# Patient Record
Sex: Male | Born: 1935 | Race: White | Hispanic: No | Marital: Married | Smoking: Never smoker
Health system: Southern US, Community
[De-identification: ages and names within clinical notes are randomized; demographics above are authoritative.]

## PROBLEM LIST (undated history)

## (undated) DIAGNOSIS — H3530 Unspecified macular degeneration: Secondary | ICD-10-CM

## (undated) DIAGNOSIS — E039 Hypothyroidism, unspecified: Secondary | ICD-10-CM

## (undated) DIAGNOSIS — I10 Essential (primary) hypertension: Secondary | ICD-10-CM

## (undated) HISTORY — PX: TONSILLECTOMY: SUR1361

## (undated) HISTORY — PX: NASAL POLYP EXCISION: SHX2068

---

## 1997-07-07 ENCOUNTER — Ambulatory Visit (HOSPITAL_COMMUNITY): Admission: RE | Admit: 1997-07-07 | Discharge: 1997-07-07 | Payer: Self-pay | Admitting: *Deleted

## 2001-11-07 ENCOUNTER — Encounter: Payer: Self-pay | Admitting: Internal Medicine

## 2001-11-07 ENCOUNTER — Encounter: Admission: RE | Admit: 2001-11-07 | Discharge: 2001-11-07 | Payer: Self-pay | Admitting: Internal Medicine

## 2002-10-08 ENCOUNTER — Ambulatory Visit (HOSPITAL_COMMUNITY): Admission: RE | Admit: 2002-10-08 | Discharge: 2002-10-08 | Payer: Self-pay | Admitting: *Deleted

## 2002-10-08 ENCOUNTER — Encounter: Payer: Self-pay | Admitting: *Deleted

## 2002-12-04 ENCOUNTER — Ambulatory Visit (HOSPITAL_COMMUNITY): Admission: RE | Admit: 2002-12-04 | Discharge: 2002-12-04 | Payer: Self-pay | Admitting: *Deleted

## 2003-12-30 ENCOUNTER — Encounter: Admission: RE | Admit: 2003-12-30 | Discharge: 2003-12-30 | Payer: Self-pay | Admitting: Internal Medicine

## 2005-06-17 ENCOUNTER — Encounter: Admission: RE | Admit: 2005-06-17 | Discharge: 2005-06-17 | Payer: Self-pay | Admitting: Internal Medicine

## 2007-12-11 ENCOUNTER — Encounter: Admission: RE | Admit: 2007-12-11 | Discharge: 2007-12-11 | Payer: Self-pay | Admitting: Internal Medicine

## 2008-05-29 ENCOUNTER — Emergency Department (HOSPITAL_BASED_OUTPATIENT_CLINIC_OR_DEPARTMENT_OTHER): Admission: EM | Admit: 2008-05-29 | Discharge: 2008-05-29 | Payer: Self-pay | Admitting: Emergency Medicine

## 2008-05-29 ENCOUNTER — Ambulatory Visit: Payer: Self-pay | Admitting: Interventional Radiology

## 2009-06-08 ENCOUNTER — Encounter: Admission: RE | Admit: 2009-06-08 | Discharge: 2009-06-08 | Payer: Self-pay | Admitting: Internal Medicine

## 2009-08-19 ENCOUNTER — Encounter: Admission: RE | Admit: 2009-08-19 | Discharge: 2009-08-19 | Payer: Self-pay | Admitting: Internal Medicine

## 2010-06-09 ENCOUNTER — Emergency Department (HOSPITAL_COMMUNITY)
Admission: EM | Admit: 2010-06-09 | Discharge: 2010-06-09 | Disposition: A | Discharge status: Home / Self Care | Payer: Medicare Other | Attending: Emergency Medicine | Admitting: Emergency Medicine

## 2010-06-09 ENCOUNTER — Emergency Department (HOSPITAL_COMMUNITY): Payer: Medicare Other

## 2010-06-09 DIAGNOSIS — Y929 Unspecified place or not applicable: Secondary | ICD-10-CM | POA: Insufficient documentation

## 2010-06-09 DIAGNOSIS — Z7982 Long term (current) use of aspirin: Secondary | ICD-10-CM | POA: Insufficient documentation

## 2010-06-09 DIAGNOSIS — S0003XA Contusion of scalp, initial encounter: Secondary | ICD-10-CM | POA: Insufficient documentation

## 2010-06-09 DIAGNOSIS — S61409A Unspecified open wound of unspecified hand, initial encounter: Secondary | ICD-10-CM | POA: Insufficient documentation

## 2010-06-09 DIAGNOSIS — IMO0002 Reserved for concepts with insufficient information to code with codable children: Secondary | ICD-10-CM | POA: Insufficient documentation

## 2010-06-09 DIAGNOSIS — Z79899 Other long term (current) drug therapy: Secondary | ICD-10-CM | POA: Insufficient documentation

## 2010-06-09 DIAGNOSIS — I10 Essential (primary) hypertension: Secondary | ICD-10-CM | POA: Insufficient documentation

## 2010-06-09 DIAGNOSIS — W010XXA Fall on same level from slipping, tripping and stumbling without subsequent striking against object, initial encounter: Secondary | ICD-10-CM | POA: Insufficient documentation

## 2010-06-09 DIAGNOSIS — K219 Gastro-esophageal reflux disease without esophagitis: Secondary | ICD-10-CM | POA: Insufficient documentation

## 2010-06-09 DIAGNOSIS — M79609 Pain in unspecified limb: Secondary | ICD-10-CM | POA: Insufficient documentation

## 2010-06-09 LAB — CBC
HCT: 35.5 % — ABNORMAL LOW (ref 39.0–52.0)
Hemoglobin: 12.5 g/dL — ABNORMAL LOW (ref 13.0–17.0)
MCH: 34.8 pg — ABNORMAL HIGH (ref 26.0–34.0)
MCHC: 35.2 g/dL (ref 30.0–36.0)
MCV: 98.9 fL (ref 78.0–100.0)
Platelets: 206 10*3/uL (ref 150–400)
RBC: 3.59 MIL/uL — ABNORMAL LOW (ref 4.22–5.81)
RDW: 12.9 % (ref 11.5–15.5)
WBC: 6.4 10*3/uL (ref 4.0–10.5)

## 2010-06-09 LAB — BASIC METABOLIC PANEL
BUN: 17 mg/dL (ref 6–23)
CO2: 30 mEq/L (ref 19–32)
Calcium: 9.4 mg/dL (ref 8.4–10.5)
Chloride: 101 mEq/L (ref 96–112)
Creatinine, Ser: 1.04 mg/dL (ref 0.4–1.5)
GFR calc Af Amer: >60 mL/min (ref >60)
GFR calc non Af Amer: >60 mL/min (ref >60)
Glucose, Bld: 91 mg/dL (ref 70–99)
Potassium: 3.3 mEq/L — ABNORMAL LOW (ref 3.5–5.1)
Sodium: 138 mEq/L (ref 135–145)

## 2010-06-09 LAB — URINALYSIS, ROUTINE W REFLEX MICROSCOPIC
Bilirubin Urine: NEGATIVE
Glucose, UA: NEGATIVE mg/dL
Hgb urine dipstick: NEGATIVE
Ketones, ur: NEGATIVE mg/dL
Nitrite: NEGATIVE
Protein, ur: NEGATIVE mg/dL
Specific Gravity, Urine: 1.015 (ref 1.005–1.030)
Urobilinogen, UA: 0.2 mg/dL (ref 0.0–1.0)
pH: 7 (ref 5.0–8.0)

## 2010-06-09 LAB — DIFFERENTIAL
Basophils Absolute: 0 10*3/uL (ref 0.0–0.1)
Basophils Relative: 0 % (ref 0–1)
Eosinophils Absolute: 0.1 10*3/uL (ref 0.0–0.7)
Eosinophils Relative: 1 % (ref 0–5)
Lymphocytes Relative: 17 % (ref 12–46)
Lymphs Abs: 1.1 10*3/uL (ref 0.7–4.0)
Monocytes Absolute: 0.4 10*3/uL (ref 0.1–1.0)
Monocytes Relative: 7 % (ref 3–12)
Neutro Abs: 4.8 10*3/uL (ref 1.7–7.7)
Neutrophils Relative %: 75 % (ref 43–77)

## 2010-07-30 NOTE — Op Note (Signed)
   NAME:  Dustin Roy, Dustin Roy                        ACCOUNT NO.:  [id number]   MEDICAL RECORD NO.:  [id number]                   PATIENT TYPE:  AMB   LOCATION:  ENDO                                 FACILITY:  [facility]   PHYSICIAN:  Georgiana Spinner, M.D.                 DATE OF BIRTH:  1921/02/17   DATE OF PROCEDURE:  10/08/2002  DATE OF DISCHARGE:                                 OPERATIVE REPORT   PROCEDURE:  Colonoscopy.   INDICATIONS:  Colon polyps.   ANESTHESIA:  Demerol 80 mg, Versed 7 mg.   DESCRIPTION OF PROCEDURE:  With the patient mildly sedated in the left  lateral decubitus position, the Olympus videoscopic variable-stiffness  colonoscope was inserted in the rectum and passed under direct vision  proximal to the splenic flexure, where we encountered tortuosity of the  colon that could not be reduced despite a change in position and abdominal  pressure.  Therefore, it was elected to terminate and withdraw, taking  circumferential views of the colonic mucosa visualized until we reached the  rectum, which appeared normal on direct and retroflexed view.  The endoscope  was straightened and withdrawn.  The patient's vital signs and pulse  oximetry remained stable.  The patient tolerated the procedure well without  apparent complications.   FINDINGS:  Negative examination.   PLAN:  Barium enema to view the proximal colon.                                               Georgiana Spinner, M.D.    GMO/MEDQ  D:  10/08/2002  T:  10/08/2002  Job:  [id number]

## 2010-07-30 NOTE — Op Note (Signed)
   NAME:  JOHANN, SANTONE                        ACCOUNT NO.:  [id number]   MEDICAL RECORD NO.:  [id number]                   PATIENT TYPE:  AMB   LOCATION:  ENDO                                 FACILITY:  [facility]   PHYSICIAN:  Georgiana Spinner, M.D.                 DATE OF BIRTH:  1920-12-18   DATE OF PROCEDURE:  DATE OF DISCHARGE:                                 OPERATIVE REPORT   PROCEDURE:  Upper endoscopy.   INDICATIONS:  GERD.   ANESTHESIA:  Demerol 50 mg, Versed 5 mg.   DESCRIPTION OF PROCEDURE:  With the patient mildly sedated in the left  lateral decubitus position, the Olympus videoscopic endoscope was inserted  in the mouth and passed under direct vision through the esophagus, which  appeared normal.  There was no evidence of Barrett's esophagus seen.  We  entered into the stomach.  The  fundus, body, antrum, duodenal bulb and  second portion of the duodenum  were visualized. From this point, the  endoscope was slowly withdrawn taking circumferential views of the duodenal  mucosa until the endoscope then pulled back into the stomach, placed in  retroflexion and viewed the stomach from below.  The endoscope was then  straightened and withdrawn, taking circumferential views of the remaining  gastric and esophageal mucosa, stopping in the antrum of the stomach where  erythematous changes were seen, photographed and biopsied.  The patient's  vital signs and pulse oximetry remained stable.  The patient tolerated the  procedure well without apparent complication.   FINDINGS:  Erythematous changes of antrum.  Await biopsy report. The patient  will call me for results and followup with me as an outpatient.                                                Georgiana Spinner, M.D.    GMO/MEDQ  D:  12/04/2002  T:  12/04/2002  Job:  [id number]   cc:   Antony Madura, M.D.  [address]  [place]  [place] [address]  Fax: [phone]

## 2010-08-16 ENCOUNTER — Ambulatory Visit
Admission: RE | Admit: 2010-08-16 | Discharge: 2010-08-16 | Disposition: A | Discharge status: Home / Self Care | Payer: Medicare Other | Source: Ambulatory Visit | Attending: Internal Medicine | Admitting: Internal Medicine

## 2010-08-16 ENCOUNTER — Other Ambulatory Visit: Payer: Self-pay | Admitting: Internal Medicine

## 2010-08-16 DIAGNOSIS — R413 Other amnesia: Secondary | ICD-10-CM

## 2011-02-28 ENCOUNTER — Ambulatory Visit
Admission: RE | Admit: 2011-02-28 | Discharge: 2011-02-28 | Disposition: A | Discharge status: Home / Self Care | Payer: Medicare Other | Source: Ambulatory Visit | Attending: Internal Medicine | Admitting: Internal Medicine

## 2011-02-28 ENCOUNTER — Other Ambulatory Visit: Payer: Self-pay | Admitting: Internal Medicine

## 2011-02-28 DIAGNOSIS — M25519 Pain in unspecified shoulder: Secondary | ICD-10-CM

## 2011-06-13 IMAGING — CT CT HEAD W/O CM
2 series · 16 of 30 positions shown, 18 images · non-contrast
Comparison: 06/17/2005

CLINICAL DATA: Memory loss.  Recent falls.

CT HEAD WITHOUT CONTRAST
TECHNIQUE: Contiguous axial images were obtained from the base of
the skull through the vertex without contrast.

[Series 3: head bone · axial · 0.49mm/px · z∈[+41,+176]mm · 8 of 64 slices shown]
[im 7/64  bone]
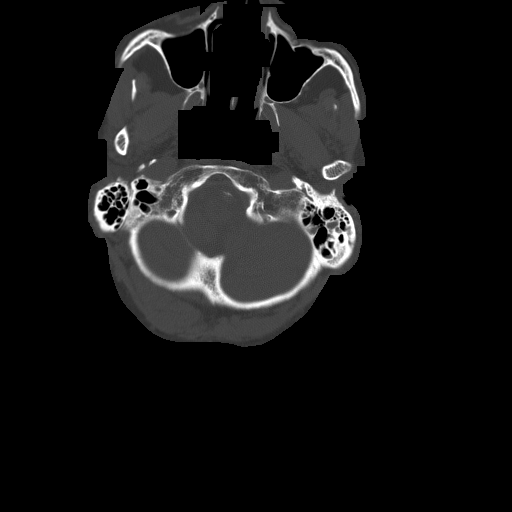
[im 14/64  bone]
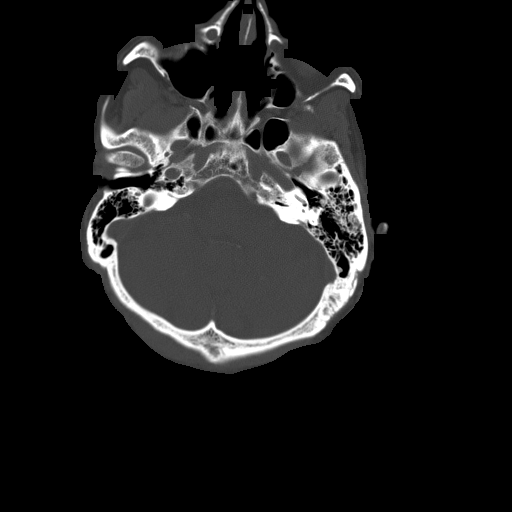
[im 20/64  bone]
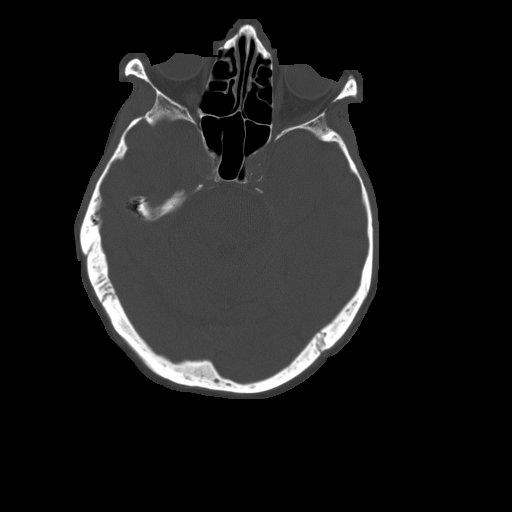
[im 27/64  bone]
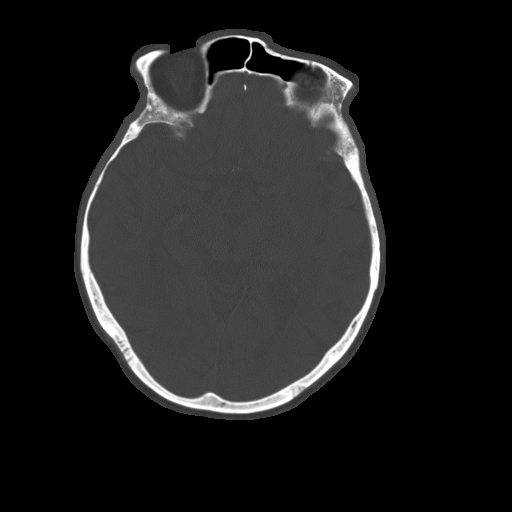
[im 37/64  bone]
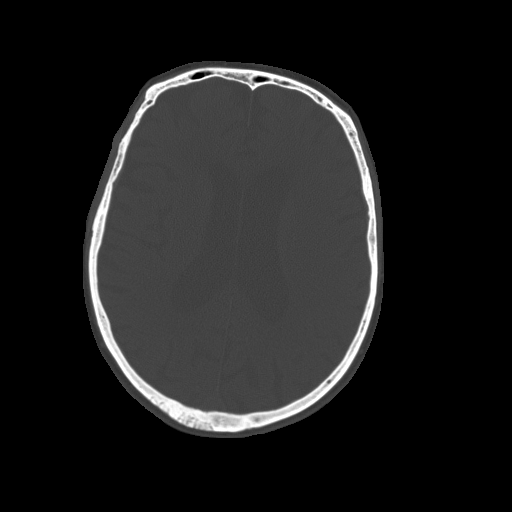
[im 44/64  bone]
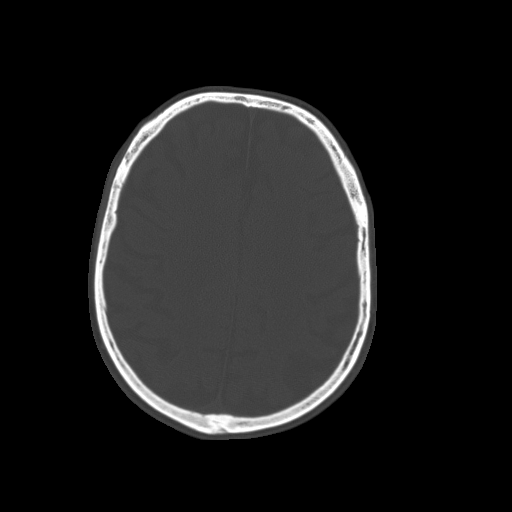
[im 50/64  bone]
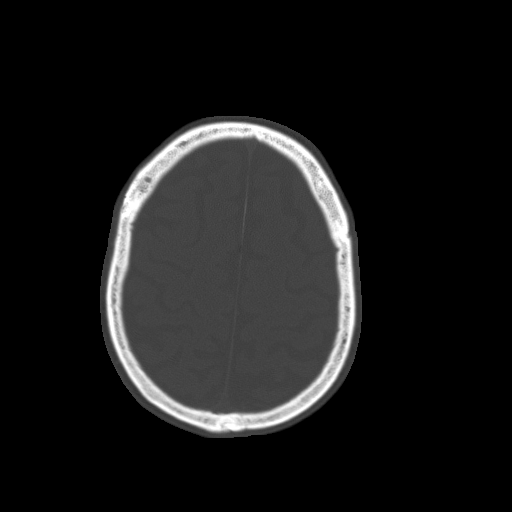
[im 57/64  bone]
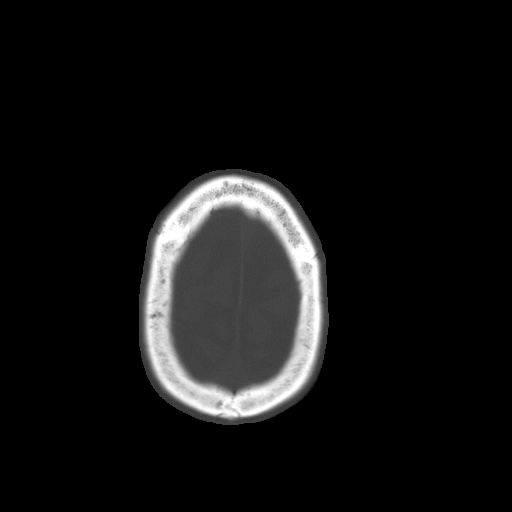

[Series 32: 3d filtered head w/o · axial · non-contrast · 0.49mm/px · z∈[+42,+172]mm · 8 of 32 slices shown, 10 images]
[im 4/32  brain]
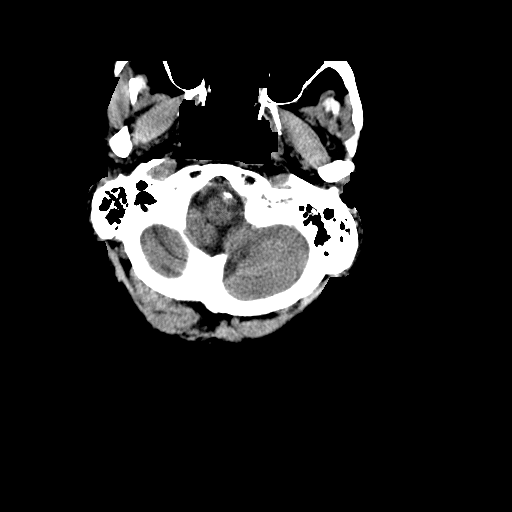
[im 4/32  bone]
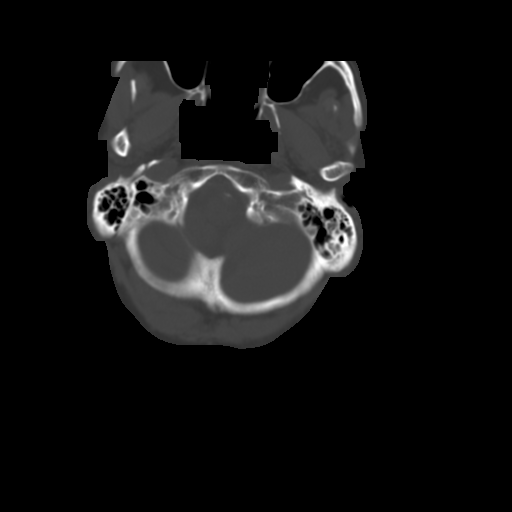
[im 7/32  brain]
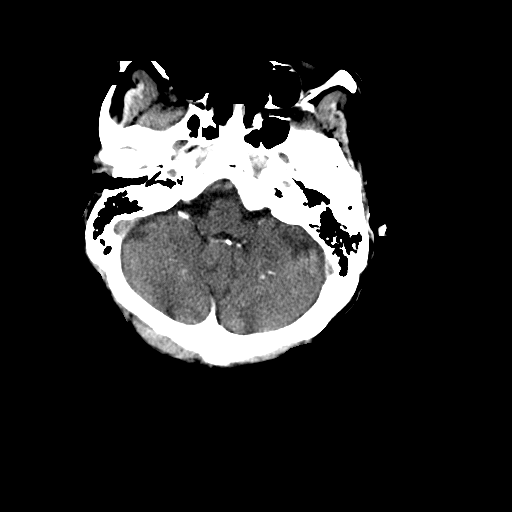
[im 11/32  brain]
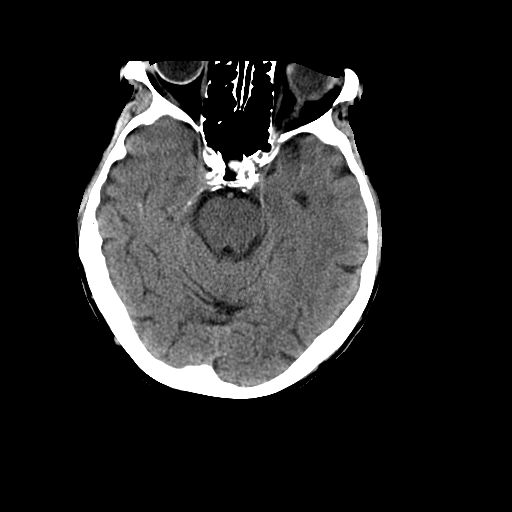
[im 14/32  brain]
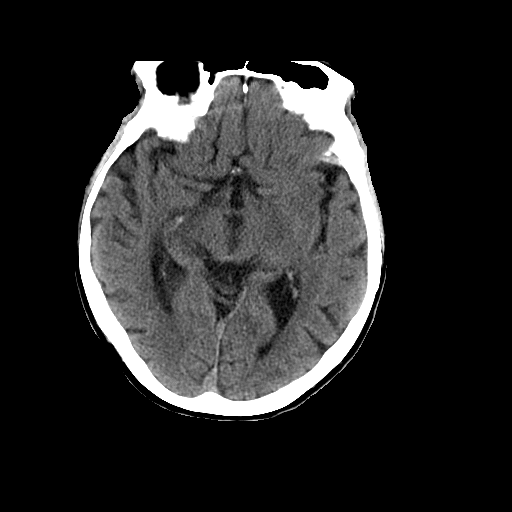
[im 18/32  brain]
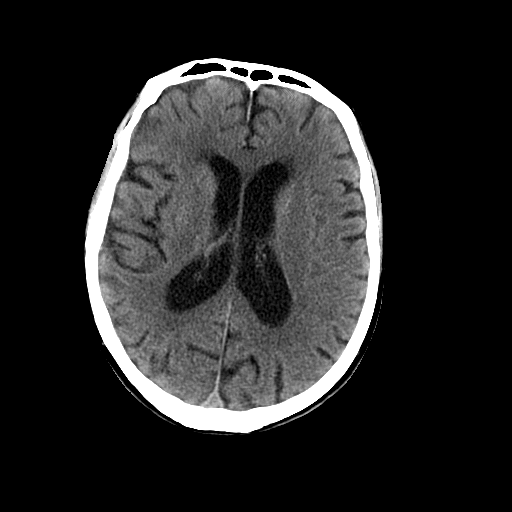
[im 18/32  bone]
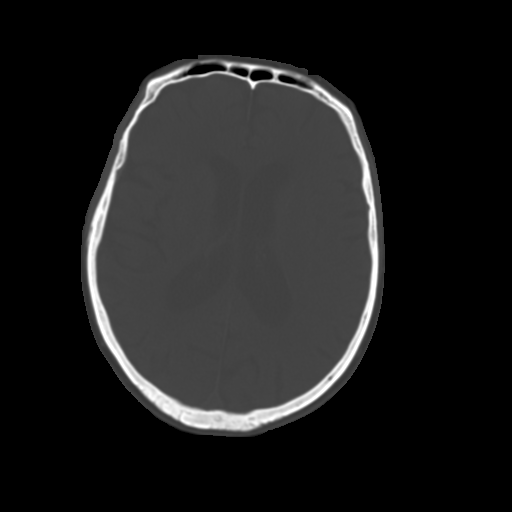
[im 21/32  brain]
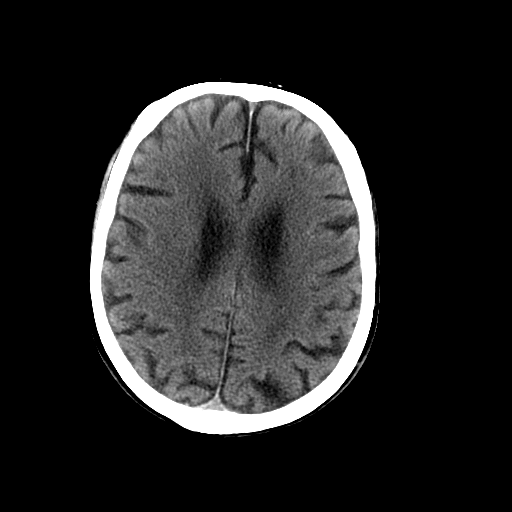
[im 25/32  brain]
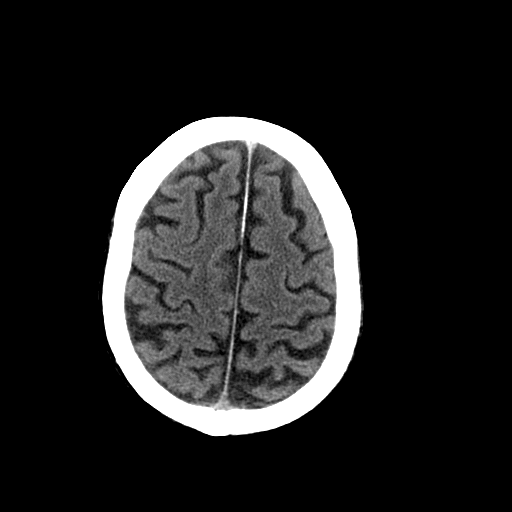
[im 28/32  brain]
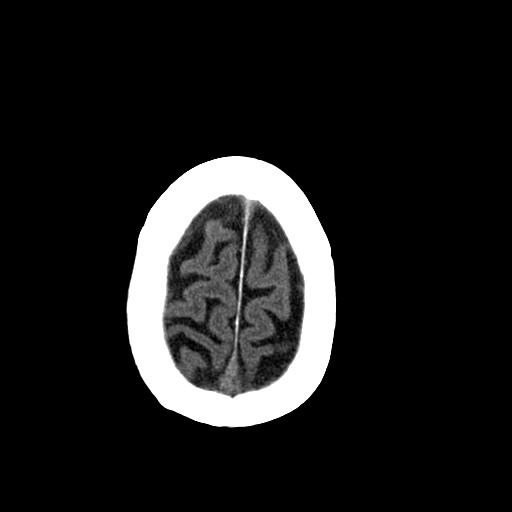

[16 of 30 positions shown; findings below may reference images not displayed]

FINDINGS: The brain shows a typical degree of age related atrophy.
There are mild to moderate chronic small vessel changes affecting
the hemispheric white matter, also fairly typical of age.  There is
no evidence of acute infarction, mass lesion, hemorrhage,
hydrocephalus or extra-axial collection.  There is atherosclerotic
calcification of the major vessels at the base the brain.  There is
some calcification within the cerebellar nuclei and the basal
ganglia.  The calvarium is unremarkable.  Sinuses, middle ears and
mastoids are clear.
IMPRESSION: No acute or reversible process.  No specific cause of the patient's
described symptoms is identified.  Atrophy and chronic small vessel
disease, fairly typical of age.

## 2012-01-25 ENCOUNTER — Other Ambulatory Visit: Payer: Self-pay | Admitting: Internal Medicine

## 2012-01-25 DIAGNOSIS — R42 Dizziness and giddiness: Secondary | ICD-10-CM

## 2012-01-27 ENCOUNTER — Ambulatory Visit
Admission: RE | Admit: 2012-01-27 | Discharge: 2012-01-27 | Disposition: A | Discharge status: Home / Self Care | Payer: Medicare Other | Source: Ambulatory Visit | Attending: Internal Medicine | Admitting: Internal Medicine

## 2012-01-27 DIAGNOSIS — R42 Dizziness and giddiness: Secondary | ICD-10-CM

## 2012-09-02 ENCOUNTER — Emergency Department (HOSPITAL_COMMUNITY): Payer: Medicare Other

## 2012-09-02 ENCOUNTER — Encounter (HOSPITAL_COMMUNITY): Payer: Self-pay | Admitting: Emergency Medicine

## 2012-09-02 ENCOUNTER — Inpatient Hospital Stay (HOSPITAL_COMMUNITY)
Admission: EM | Admit: 2012-09-02 | Discharge: 2012-09-13 | DRG: 066 | Disposition: A | Discharge status: Skilled Nursing Facility | Payer: Medicare Other | Attending: Internal Medicine | Admitting: Internal Medicine

## 2012-09-02 DIAGNOSIS — I639 Cerebral infarction, unspecified: Secondary | ICD-10-CM

## 2012-09-02 DIAGNOSIS — E039 Hypothyroidism, unspecified: Secondary | ICD-10-CM

## 2012-09-02 DIAGNOSIS — S41111A Laceration without foreign body of right upper arm, initial encounter: Secondary | ICD-10-CM

## 2012-09-02 DIAGNOSIS — R5381 Other malaise: Secondary | ICD-10-CM | POA: Diagnosis not present

## 2012-09-02 DIAGNOSIS — R5383 Other fatigue: Secondary | ICD-10-CM | POA: Diagnosis not present

## 2012-09-02 DIAGNOSIS — I10 Essential (primary) hypertension: Secondary | ICD-10-CM

## 2012-09-02 DIAGNOSIS — R4182 Altered mental status, unspecified: Secondary | ICD-10-CM

## 2012-09-02 DIAGNOSIS — R569 Unspecified convulsions: Secondary | ICD-10-CM

## 2012-09-02 DIAGNOSIS — E538 Deficiency of other specified B group vitamins: Secondary | ICD-10-CM | POA: Diagnosis present

## 2012-09-02 DIAGNOSIS — T4275XA Adverse effect of unspecified antiepileptic and sedative-hypnotic drugs, initial encounter: Secondary | ICD-10-CM | POA: Diagnosis not present

## 2012-09-02 DIAGNOSIS — Z66 Do not resuscitate: Secondary | ICD-10-CM | POA: Diagnosis present

## 2012-09-02 DIAGNOSIS — Y92009 Unspecified place in unspecified non-institutional (private) residence as the place of occurrence of the external cause: Secondary | ICD-10-CM

## 2012-09-02 DIAGNOSIS — IMO0002 Reserved for concepts with insufficient information to code with codable children: Secondary | ICD-10-CM

## 2012-09-02 DIAGNOSIS — F0391 Unspecified dementia with behavioral disturbance: Secondary | ICD-10-CM

## 2012-09-02 DIAGNOSIS — R1310 Dysphagia, unspecified: Secondary | ICD-10-CM | POA: Diagnosis not present

## 2012-09-02 DIAGNOSIS — Z23 Encounter for immunization: Secondary | ICD-10-CM

## 2012-09-02 DIAGNOSIS — S0003XA Contusion of scalp, initial encounter: Secondary | ICD-10-CM | POA: Diagnosis present

## 2012-09-02 DIAGNOSIS — S41119A Laceration without foreign body of unspecified upper arm, initial encounter: Secondary | ICD-10-CM

## 2012-09-02 DIAGNOSIS — F0390 Unspecified dementia without behavioral disturbance: Secondary | ICD-10-CM

## 2012-09-02 DIAGNOSIS — R509 Fever, unspecified: Secondary | ICD-10-CM

## 2012-09-02 DIAGNOSIS — E785 Hyperlipidemia, unspecified: Secondary | ICD-10-CM | POA: Diagnosis present

## 2012-09-02 DIAGNOSIS — S1093XA Contusion of unspecified part of neck, initial encounter: Secondary | ICD-10-CM | POA: Diagnosis present

## 2012-09-02 DIAGNOSIS — R4701 Aphasia: Secondary | ICD-10-CM

## 2012-09-02 DIAGNOSIS — W19XXXA Unspecified fall, initial encounter: Secondary | ICD-10-CM

## 2012-09-02 DIAGNOSIS — R55 Syncope and collapse: Secondary | ICD-10-CM | POA: Diagnosis present

## 2012-09-02 DIAGNOSIS — F0781 Postconcussional syndrome: Secondary | ICD-10-CM

## 2012-09-02 DIAGNOSIS — I6350 Cerebral infarction due to unspecified occlusion or stenosis of unspecified cerebral artery: Principal | ICD-10-CM | POA: Diagnosis present

## 2012-09-02 HISTORY — DX: Hypothyroidism, unspecified: E03.9

## 2012-09-02 HISTORY — DX: Essential (primary) hypertension: I10

## 2012-09-02 HISTORY — DX: Unspecified macular degeneration: H35.30

## 2012-09-02 LAB — COMPREHENSIVE METABOLIC PANEL
ALT: 16 U/L (ref 0–53)
AST: 25 U/L (ref 0–37)
Albumin: 3.5 g/dL (ref 3.5–5.2)
Alkaline Phosphatase: 120 U/L — ABNORMAL HIGH (ref 39–117)
BUN: 24 mg/dL — ABNORMAL HIGH (ref 6–23)
CO2: 28 mEq/L (ref 19–32)
Calcium: 9.4 mg/dL (ref 8.4–10.5)
Chloride: 100 mEq/L (ref 96–112)
Creatinine, Ser: 1.16 mg/dL (ref 0.50–1.35)
GFR calc Af Amer: 62 mL/min — ABNORMAL LOW (ref >90)
GFR calc non Af Amer: 54 mL/min — ABNORMAL LOW (ref >90)
Glucose, Bld: 115 mg/dL — ABNORMAL HIGH (ref 70–99)
Potassium: 3.4 mEq/L — ABNORMAL LOW (ref 3.5–5.1)
Sodium: 138 mEq/L (ref 135–145)
Total Bilirubin: 0.4 mg/dL (ref 0.3–1.2)
Total Protein: 6.5 g/dL (ref 6.0–8.3)

## 2012-09-02 LAB — URINALYSIS, ROUTINE W REFLEX MICROSCOPIC
Leukocytes, UA: NEGATIVE
Nitrite: NEGATIVE
Specific Gravity, Urine: 1.022 (ref 1.005–1.030)
Urobilinogen, UA: 0.2 mg/dL (ref 0.0–1.0)

## 2012-09-02 LAB — CBC WITH DIFFERENTIAL/PLATELET
Basophils Absolute: 0 10*3/uL (ref 0.0–0.1)
Basophils Relative: 0 % (ref 0–1)
Eosinophils Absolute: 0.1 10*3/uL (ref 0.0–0.7)
Eosinophils Relative: 1 % (ref 0–5)
HCT: 36.8 % — ABNORMAL LOW (ref 39.0–52.0)
Hemoglobin: 12.9 g/dL — ABNORMAL LOW (ref 13.0–17.0)
Lymphocytes Relative: 9 % — ABNORMAL LOW (ref 12–46)
Lymphs Abs: 0.9 10*3/uL (ref 0.7–4.0)
MCH: 34.7 pg — ABNORMAL HIGH (ref 26.0–34.0)
MCHC: 35.1 g/dL (ref 30.0–36.0)
MCV: 98.9 fL (ref 78.0–100.0)
Monocytes Absolute: 0.5 10*3/uL (ref 0.1–1.0)
Monocytes Relative: 5 % (ref 3–12)
Neutro Abs: 9.4 10*3/uL — ABNORMAL HIGH (ref 1.7–7.7)
Neutrophils Relative %: 86 % — ABNORMAL HIGH (ref 43–77)
Platelets: 181 10*3/uL (ref 150–400)
RBC: 3.72 MIL/uL — ABNORMAL LOW (ref 4.22–5.81)
RDW: 13.3 % (ref 11.5–15.5)
WBC: 10.9 10*3/uL — ABNORMAL HIGH (ref 4.0–10.5)

## 2012-09-02 LAB — URINE MICROSCOPIC-ADD ON

## 2012-09-02 LAB — PROTIME-INR: Prothrombin Time: 13.2 seconds (ref 11.6–15.2)

## 2012-09-02 LAB — TROPONIN I: Troponin I: <0.3 ng/mL (ref <0.30)

## 2012-09-02 LAB — APTT: aPTT: 26 seconds (ref 24–37)

## 2012-09-02 MED ORDER — LORAZEPAM 2 MG/ML IJ SOLN
0.5000 mg | Freq: Once | INTRAMUSCULAR | Status: AC
  Administered 2012-09-02: 0.5 mg via INTRAVENOUS
  Filled 2012-09-02: qty 1

## 2012-09-02 MED ORDER — TETANUS-DIPHTH-ACELL PERTUSSIS 5-2.5-18.5 LF-MCG/0.5 IM SUSP
0.5000 mL | Freq: Once | INTRAMUSCULAR | Status: AC
  Administered 2012-09-03: 0.5 mL via INTRAMUSCULAR
  Filled 2012-09-02 (×2): qty 0.5

## 2012-09-02 NOTE — ED Notes (Signed)
Pt medicated with  Ativan iv for the c-t

## 2012-09-02 NOTE — ED Notes (Signed)
Patient transported to CT 

## 2012-09-02 NOTE — ED Notes (Signed)
The pt was taken to c-t one hour ago but they were unable to scan him

## 2012-09-02 NOTE — ED Provider Notes (Signed)
History     CSN: [id number]  Arrival date & time 09/02/12  4098   First MD Initiated Contact with Patient 09/02/12 1926      Chief Complaint  Patient presents with  . Altered Mental Status    (Consider location/radiation/quality/duration/timing/severity/associated sxs/prior treatment) HPI Pt found down in bathroom for unknown period of time. Pt has baseline dementia. Level 5 caveat. Pt seen 20-30 min prior to being found down. Pt with RUE skin tear. Unknown LOC.  Past Medical History  Diagnosis Date  . Hypertension   . Hypothyroid     History reviewed. No pertinent past surgical history.  History reviewed. No pertinent family history.  History  Substance Use Topics  . Smoking status: Never Smoker   . Smokeless tobacco: Not on file  . Alcohol Use: Not on file      Review of Systems  Unable to perform ROS   Allergies  Amoxicillin  Home Medications   No current outpatient prescriptions on file.  BP 163/70  Pulse 84  Temp(Src) 97.3 F (36.3 C) (Axillary)  Resp 20  Ht 5\' 9"  (1.753 m)  Wt 136 lb 3.9 oz (61.8 kg)  BMI 20.11 kg/m2  SpO2 100%  Physical Exam  Nursing note and vitals reviewed. Constitutional: He appears well-developed and well-nourished. No distress.  HENT:  Head: Normocephalic and atraumatic.  Mouth/Throat: Oropharynx is clear and moist.  Eyes: EOM are normal. Pupils are equal, round, and reactive to light.  Neck: Normal range of motion. Neck supple.  No posterior cervical tenderness  Cardiovascular: Regular rhythm.   tachycardia  Pulmonary/Chest: Effort normal and breath sounds normal. No respiratory distress. He has no wheezes. He has no rales.  Abdominal: Soft. Bowel sounds are normal. He exhibits no distension and no mass. There is no tenderness. There is no rebound and no guarding.  Musculoskeletal: Normal range of motion. He exhibits no edema and no tenderness.  Neurological: He is alert.  Moving all ext, confused, aggitated.  Following simple commands.   Skin: Skin is warm and dry. No rash noted. No erythema.    ED Course  Procedures (including critical care time)  Labs Reviewed  CBC WITH DIFFERENTIAL - Abnormal; Notable for the following:    WBC 10.9 (*)    RBC 3.72 (*)    Hemoglobin 12.9 (*)    HCT 36.8 (*)    MCH 34.7 (*)    Neutrophils Relative % 86 (*)    Neutro Abs 9.4 (*)    Lymphocytes Relative 9 (*)    All other components within normal limits  COMPREHENSIVE METABOLIC PANEL - Abnormal; Notable for the following:    Potassium 3.4 (*)    Glucose, Bld 115 (*)    BUN 24 (*)    Alkaline Phosphatase 120 (*)    GFR calc non Af Amer 54 (*)    GFR calc Af Amer 62 (*)    All other components within normal limits  URINALYSIS, ROUTINE W REFLEX MICROSCOPIC - Abnormal; Notable for the following:    APPearance CLOUDY (*)    Hgb urine dipstick TRACE (*)    All other components within normal limits  URINE MICROSCOPIC-ADD ON - Abnormal; Notable for the following:    Squamous Epithelial / LPF FEW (*)    Bacteria, UA FEW (*)    Casts HYALINE CASTS (*)    All other components within normal limits  CBC - Abnormal; Notable for the following:    RBC 3.83 (*)    HCT  37.7 (*)    MCH 35.0 (*)    All other components within normal limits  COMPREHENSIVE METABOLIC PANEL - Abnormal; Notable for the following:    Glucose, Bld 110 (*)    Albumin 3.0 (*)    Alkaline Phosphatase 120 (*)    GFR calc non Af Amer 72 (*)    GFR calc Af Amer 84 (*)    All other components within normal limits  URINE CULTURE  TROPONIN I  PROTIME-INR  APTT  TROPONIN I  TROPONIN I  TROPONIN I  TSH  MAGNESIUM  TSH  T4, FREE  RPR  FOLATE  VITAMIN B12  SEDIMENTATION RATE   Ct Head Wo Contrast  09/02/2012   *RADIOLOGY REPORT*  Clinical Data:  Altered mental status  CT HEAD WITHOUT CONTRAST CT CERVICAL SPINE WITHOUT CONTRAST  Technique:  Multidetector CT imaging of the head and cervical spine was performed following the standard  protocol without intravenous contrast.  Multiplanar CT image reconstructions of the cervical spine were also generated.  Comparison:  CT 01/27/2012  CT HEAD  Findings: Moderate atrophy.  Chronic microvascular ischemic change in the white matter.  No acute infarct.  Negative for hemorrhage or mass.  No subdural fluid collection.  Negative for skull fracture.  IMPRESSION: No acute intracranial abnormality.  CT CERVICAL SPINE  Findings: Negative for fracture  Normal alignment.  Mild disc degeneration and spondylosis C4-C7. Mild facet degeneration.  IMPRESSION: Negative for fracture.   Original Report Authenticated By: Janeece Riggers, M.D.   Ct Cervical Spine Wo Contrast  09/02/2012   *RADIOLOGY REPORT*  Clinical Data:  Altered mental status  CT HEAD WITHOUT CONTRAST CT CERVICAL SPINE WITHOUT CONTRAST  Technique:  Multidetector CT imaging of the head and cervical spine was performed following the standard protocol without intravenous contrast.  Multiplanar CT image reconstructions of the cervical spine were also generated.  Comparison:  CT 01/27/2012  CT HEAD  Findings: Moderate atrophy.  Chronic microvascular ischemic change in the white matter.  No acute infarct.  Negative for hemorrhage or mass.  No subdural fluid collection.  Negative for skull fracture.  IMPRESSION: No acute intracranial abnormality.  CT CERVICAL SPINE  Findings: Negative for fracture  Normal alignment.  Mild disc degeneration and spondylosis C4-C7. Mild facet degeneration.  IMPRESSION: Negative for fracture.   Original Report Authenticated By: Janeece Riggers, M.D.   Dg Chest Port 1 View  09/02/2012   *RADIOLOGY REPORT*  Clinical Data: Altered mental status  PORTABLE CHEST - 1 VIEW  Comparison: None  Findings: Mild left lower lobe atelectasis.  Negative for heart failure or pneumonia.  No mass or effusion.  Severe degenerative change in the shoulder joints bilaterally.  IMPRESSION: Mild left lower lobe atelectasis   Original Report Authenticated  By: Janeece Riggers, M.D.     1. Fall, initial encounter   2. Altered mental status   3. Skin tear   4. Concussion syndrome   5. HTN (hypertension)   6. Hypothyroidism       MDM    Pt remains confused and mildy agitated. Wife states not at baseline. Discussed with [facility] who will admit       Loren Racer, MD 09/04/12 [id number]

## 2012-09-02 NOTE — ED Notes (Signed)
Per EMS- Pt found by wife on floor between bathtub and shower. Skin tear to R foor arm 3-4'' in length. Bleeding controlled with compression bandage. Pt has Hx of alzheimer's. Pt not at baseline per wife. HR initially 120's O2 mid 90's. CBG 140. Unknown time in bathroom on floor. Stroke screen negative with EMS. History of HTN and hypothyroidism.

## 2012-09-03 ENCOUNTER — Encounter (HOSPITAL_COMMUNITY): Payer: Self-pay | Admitting: General Practice

## 2012-09-03 DIAGNOSIS — Y92009 Unspecified place in unspecified non-institutional (private) residence as the place of occurrence of the external cause: Secondary | ICD-10-CM

## 2012-09-03 DIAGNOSIS — I10 Essential (primary) hypertension: Secondary | ICD-10-CM | POA: Diagnosis present

## 2012-09-03 DIAGNOSIS — W19XXXA Unspecified fall, initial encounter: Secondary | ICD-10-CM

## 2012-09-03 DIAGNOSIS — E039 Hypothyroidism, unspecified: Secondary | ICD-10-CM | POA: Diagnosis present

## 2012-09-03 DIAGNOSIS — F0781 Postconcussional syndrome: Secondary | ICD-10-CM | POA: Insufficient documentation

## 2012-09-03 LAB — TSH: TSH: 1.297 u[IU]/mL (ref 0.350–4.500)

## 2012-09-03 LAB — TROPONIN I: Troponin I: <0.3 ng/mL (ref <0.30)

## 2012-09-03 MED ORDER — TRIAMTERENE-HCTZ 37.5-25 MG PO TABS
1.0000 | ORAL_TABLET | Freq: Every day | ORAL | Status: DC
  Administered 2012-09-03 – 2012-09-04 (×2): 1 via ORAL
  Filled 2012-09-03 (×4): qty 1

## 2012-09-03 MED ORDER — LORAZEPAM 2 MG/ML IJ SOLN: 0.5000 mg | Freq: Once | INTRAMUSCULAR | Status: DC

## 2012-09-03 MED ORDER — LORAZEPAM 0.5 MG PO TABS
0.5000 mg | ORAL_TABLET | Freq: Once | ORAL | Status: AC
  Administered 2012-09-03: 0.5 mg via ORAL
  Filled 2012-09-03: qty 1

## 2012-09-03 MED ORDER — ASPIRIN EC 81 MG PO TBEC
81.0000 mg | DELAYED_RELEASE_TABLET | Freq: Every day | ORAL | Status: DC
  Administered 2012-09-03 – 2012-09-04 (×2): 81 mg via ORAL
  Filled 2012-09-03 (×6): qty 1

## 2012-09-03 MED ORDER — LEVOTHYROXINE SODIUM 50 MCG PO TABS
50.0000 ug | ORAL_TABLET | Freq: Every day | ORAL | Status: DC
  Administered 2012-09-03 – 2012-09-05 (×3): 50 ug via ORAL
  Filled 2012-09-03 (×5): qty 1

## 2012-09-03 NOTE — ED Notes (Signed)
Water and graham crackers given to patient to eat per Dr.Le

## 2012-09-03 NOTE — ED Notes (Signed)
Admitting doctor at the bedside 

## 2012-09-03 NOTE — ED Notes (Signed)
Report called to 4n 

## 2012-09-03 NOTE — Progress Notes (Signed)
[facility] PROGRESS NOTE  BIRDIE BEVERIDGE ZOX:[id number] DOB: Jul 26, 1920 DOA: 09/02/2012 PCP: No primary provider on file.  Assessment/Plan: 1. Postconcussive syndrome; per wife patient at baseline except for increased weakness in lower extremities. Obtain PT/OT for evaluation 2. dementia; obtain neuro consult for dementia workup to discuss care options with wife  HPI/Subjective: Dustin Roy is an 79 y.o. WM PMHx of noticeable dementia which started 4 months ago her wife (was off her Aricept by PCP never made decision on taking, never evaluated by neuro) , HTN, hypothyroidism, lives at home with his wife, found down in his bathroom. He reportedly hit his head. No seizure activity reported. There has been no complaint of HA, chest pain, nausea, vomiting, palpitation or lightheadedness. Evaluation in the ER included negative head CT and unremarkable serology. No EKG done. Wife stated that he was a little more confused than usual, so hospitalist was asked to admit him for OBS, likely due to post concussion syndrome. TODAY alert and oriented x1 (to person), able to follow commands   Objective: Filed Vitals:   09/03/12 0030 09/03/12 0206 09/03/12 0542 09/03/12 1548  BP: 131/64 161/67 145/68 134/85  Pulse: 79 79 74 93  Temp: 97.9 F (36.6 C) 97.4 F (36.3 C) 97.7 F (36.5 C) 97.2 F (36.2 C)  TempSrc: Axillary Oral Oral Oral  Resp: 16 17 17 20   Height:  5\' 9"  (1.753 m)    Weight:  61.8 kg (136 lb 3.9 oz)    SpO2: 100% 97% 96% 100%    Intake/Output Summary (Last 24 hours) at 09/03/12 2005 Last data filed at 09/03/12 1330  Gross per 24 hour  Intake    240 ml  Output      3 ml  Net    237 ml   Filed Weights   09/03/12 0206  Weight: 61.8 kg (136 lb 3.9 oz)    Exam:   General:  Sitting comfortably in bed confused  Cardiovascular: Regular rhythm and rate, negative murmurs rubs gallops  Respiratory: Clear to auscultation bilaterally  Abdomen: Soft, nontender,  nondistended plus bowel sounds  Neurologic alert and oriented x1 (2 self), unable to answer simple questions (confabulates), INR is 2 through 12 intact, upper extremity/lower extremity strength 5/5, sensation intact. Remainder of exam deferred   Data Reviewed: Basic Metabolic Panel:  Recent Labs Lab 09/02/12 2004  NA 138  K 3.4*  CL 100  CO2 28  GLUCOSE 115*  BUN 24*  CREATININE 1.16  CALCIUM 9.4   Liver Function Tests:  Recent Labs Lab 09/02/12 2004  AST 25  ALT 16  ALKPHOS 120*  BILITOT 0.4  PROT 6.5  ALBUMIN 3.5   No results found for this basename: LIPASE, AMYLASE,  in the last 168 hours No results found for this basename: AMMONIA,  in the last 168 hours CBC:  Recent Labs Lab 09/02/12 2004  WBC 10.9*  NEUTROABS 9.4*  HGB 12.9*  HCT 36.8*  MCV 98.9  PLT 181   Cardiac Enzymes:  Recent Labs Lab 09/02/12 2004 09/03/12 0052 09/03/12 0650 09/03/12 1228  TROPONINI <0.30 <0.30 <0.30 <0.30   BNP (last 3 results) No results found for this basename: PROBNP,  in the last 8760 hours CBG: No results found for this basename: GLUCAP,  in the last 168 hours  No results found for this or any previous visit (from the past 240 hour(s)).   Studies: Ct Head Wo Contrast  09/02/2012   *RADIOLOGY REPORT*  Clinical Data:  Altered mental  status  CT HEAD WITHOUT CONTRAST CT CERVICAL SPINE WITHOUT CONTRAST  Technique:  Multidetector CT imaging of the head and cervical spine was performed following the standard protocol without intravenous contrast.  Multiplanar CT image reconstructions of the cervical spine were also generated.  Comparison:  CT 01/27/2012  CT HEAD  Findings: Moderate atrophy.  Chronic microvascular ischemic change in the white matter.  No acute infarct.  Negative for hemorrhage or mass.  No subdural fluid collection.  Negative for skull fracture.  IMPRESSION: No acute intracranial abnormality.  CT CERVICAL SPINE  Findings: Negative for fracture  Normal  alignment.  Mild disc degeneration and spondylosis C4-C7. Mild facet degeneration.  IMPRESSION: Negative for fracture.   Original Report Authenticated By: Janeece Riggers, M.D.   Ct Cervical Spine Wo Contrast  09/02/2012   *RADIOLOGY REPORT*  Clinical Data:  Altered mental status  CT HEAD WITHOUT CONTRAST CT CERVICAL SPINE WITHOUT CONTRAST  Technique:  Multidetector CT imaging of the head and cervical spine was performed following the standard protocol without intravenous contrast.  Multiplanar CT image reconstructions of the cervical spine were also generated.  Comparison:  CT 01/27/2012  CT HEAD  Findings: Moderate atrophy.  Chronic microvascular ischemic change in the white matter.  No acute infarct.  Negative for hemorrhage or mass.  No subdural fluid collection.  Negative for skull fracture.  IMPRESSION: No acute intracranial abnormality.  CT CERVICAL SPINE  Findings: Negative for fracture  Normal alignment.  Mild disc degeneration and spondylosis C4-C7. Mild facet degeneration.  IMPRESSION: Negative for fracture.   Original Report Authenticated By: Janeece Riggers, M.D.   Dg Chest Port 1 View  09/02/2012   *RADIOLOGY REPORT*  Clinical Data: Altered mental status  PORTABLE CHEST - 1 VIEW  Comparison: None  Findings: Mild left lower lobe atelectasis.  Negative for heart failure or pneumonia.  No mass or effusion.  Severe degenerative change in the shoulder joints bilaterally.  IMPRESSION: Mild left lower lobe atelectasis   Original Report Authenticated By: Janeece Riggers, M.D.    Scheduled Meds: . aspirin EC  81 mg Oral Daily  . levothyroxine  50 mcg Oral QAC breakfast  . LORazepam  0.5 mg Intravenous Once  . triamterene-hydrochlorothiazide  1 tablet Oral Daily   Continuous Infusions:   Principal Problem:   Concussion syndrome Active Problems:   Fall at home   HTN (hypertension)   Hypothyroidism    Time spent: 34    Jarrid Lienhard, Roselind Messier  [facility] Pager [phone]. If 7PM-7AM, please  contact night-coverage at [email or web address], password [facility] 09/03/2012, 8:05 PM  LOS: 1 day

## 2012-09-03 NOTE — H&P (Signed)
[facility] History and Physical  WILLIOM CEDAR Roy:[id number] DOB: 31-Jan-1921    PCP:  None  Chief Complaint:  Larey Seat and increased confusion.  HPI: Dustin Roy is an 79 y.o. male with hx of dementia with baseline confusion, HTN, hypothyroidism, lives at home with his wife, found down in his bathroom.  He reportedly hit his head.  No seizure activity reported.  There has been no complaint of HA, chest pain, nausea, vomiting, palpitation or lightheadedness.  Evaluation in the ER included negative head CT and unremarkable serology.  No EKG done.  Wife stated that he was a little more confused than usual, so hospitalist was asked to admit him for OBS, likely due to post concussion syndrome.  Rewiew of Systems:  Constitutional: Negative for malaise, fever and chills. No significant weight loss or weight gain Eyes: Negative for eye pain, redness and discharge, diplopia, visual changes, or flashes of light. ENMT: Negative for ear pain, hoarseness, nasal congestion, sinus pressure and sore throat. No headaches; tinnitus, drooling, or problem swallowing. Cardiovascular: Negative for chest pain, palpitations, diaphoresis, dyspnea and peripheral edema. ; No orthopnea, PND Respiratory: Negative for cough, hemoptysis, wheezing and stridor. No pleuritic chestpain. Gastrointestinal: Negative for nausea, vomiting, diarrhea, constipation, abdominal pain, melena, blood in stool, hematemesis, jaundice and rectal bleeding.    Genitourinary: Negative for frequency, dysuria, incontinence,flank pain and hematuria; Musculoskeletal: Negative for back pain and neck pain. Negative for swelling and trauma.;  Skin: . Negative for pruritus, rash, abrasions, bruising and skin lesion.; ulcerations Neuro: Negative for headache, lightheadedness and neck stiffness. Negative for weakness, altered level of consciousness , altered mental status, extremity weakness, burning feet, involuntary movement, seizure and  syncope.  Psych: negative for anxiety, depression, insomnia, tearfulness, panic attacks, hallucinations, paranoia, suicidal or homicidal ideation    Past Medical History  Diagnosis Date  . Hypertension   . Hypothyroid     History reviewed. No pertinent past surgical history.  Medications:  HOME MEDS: Prior to Admission medications   Medication Sig Start Date End Date Taking? Authorizing Provider  aspirin EC 81 MG tablet Take 81 mg by mouth daily.   Yes Historical Provider, MD  levothyroxine (SYNTHROID, LEVOTHROID) 50 MCG tablet Take 50 mcg by mouth daily before breakfast.   Yes Historical Provider, MD  triamterene-hydrochlorothiazide (MAXZIDE) 75-50 MG per tablet Take 0.5 tablets by mouth daily.   Yes Historical Provider, MD     Allergies:  Allergies  Allergen Reactions  . Amoxicillin Other (See Comments)    Hallucinations; fever    Social History:   has no tobacco, alcohol, and drug history on file.  Family History: No family history on file.   Physical Exam: Filed Vitals:   09/02/12 1937 09/02/12 2000  BP: 138/66 121/69  Pulse: 89 88  Temp: 97.5 F (36.4 C)   TempSrc: Oral   Resp: 16 20  SpO2: 99% 99%   Blood pressure 121/69, pulse 88, temperature 97.5 F (36.4 C), temperature source Oral, resp. rate 20, SpO2 99.00%.  GEN:  Pleasant  patient lying in the stretcher in no acute distress; cooperative with exam. PSYCH: Confused; does not appear anxious or depressed; affect is appropriate. HEENT: Mucous membranes pink and anicteric; PERRLA; EOM intact; no cervical lymphadenopathy nor thyromegaly or carotid bruit; no JVD; There were no stridor. Neck is very supple. Breasts:: Not examined CHEST WALL: No tenderness CHEST: Normal respiration, clear to auscultation bilaterally.  HEART: Regular rate and rhythm.  There are no murmur, rub, or gallops.  BACK: No kyphosis or scoliosis; no CVA tenderness ABDOMEN: soft and non-tender; no masses, no organomegaly, normal  abdominal bowel sounds; no pannus; no intertriginous candida. There is no rebound and no distention. Rectal Exam: Not done EXTREMITIES: No bone or joint deformity; age-appropriate arthropathy of the hands and knees; no edema; no ulcerations.  There is no calf tenderness. Genitalia: not examined PULSES: 2+ and symmetric SKIN: Normal hydration no rash or ulceration CNS: Cranial nerves 2-12 grossly intact no focal lateralizing neurologic deficit.  Speech is fluent; uvula elevated with phonation, facial symmetry and tongue midline. DTR are normal bilaterally, cerebella exam is intact, barbinski is negative and strengths are equaled bilaterally.  No sensory loss.   Labs on Admission:  Basic Metabolic Panel:  Recent Labs Lab 09/02/12 2004  NA 138  K 3.4*  CL 100  CO2 28  GLUCOSE 115*  BUN 24*  CREATININE 1.16  CALCIUM 9.4   Liver Function Tests:  Recent Labs Lab 09/02/12 2004  AST 25  ALT 16  ALKPHOS 120*  BILITOT 0.4  PROT 6.5  ALBUMIN 3.5   No results found for this basename: LIPASE, AMYLASE,  in the last 168 hours No results found for this basename: AMMONIA,  in the last 168 hours CBC:  Recent Labs Lab 09/02/12 2004  WBC 10.9*  NEUTROABS 9.4*  HGB 12.9*  HCT 36.8*  MCV 98.9  PLT 181   Cardiac Enzymes:  Recent Labs Lab 09/02/12 2004  TROPONINI <0.30    CBG: No results found for this basename: GLUCAP,  in the last 168 hours   Radiological Exams on Admission: Ct Head Wo Contrast  09/02/2012   *RADIOLOGY REPORT*  Clinical Data:  Altered mental status  CT HEAD WITHOUT CONTRAST CT CERVICAL SPINE WITHOUT CONTRAST  Technique:  Multidetector CT imaging of the head and cervical spine was performed following the standard protocol without intravenous contrast.  Multiplanar CT image reconstructions of the cervical spine were also generated.  Comparison:  CT 01/27/2012  CT HEAD  Findings: Moderate atrophy.  Chronic microvascular ischemic change in the white matter.  No  acute infarct.  Negative for hemorrhage or mass.  No subdural fluid collection.  Negative for skull fracture.  IMPRESSION: No acute intracranial abnormality.  CT CERVICAL SPINE  Findings: Negative for fracture  Normal alignment.  Mild disc degeneration and spondylosis C4-C7. Mild facet degeneration.  IMPRESSION: Negative for fracture.   Original Report Authenticated By: Janeece Riggers, M.D.   Ct Cervical Spine Wo Contrast  09/02/2012   *RADIOLOGY REPORT*  Clinical Data:  Altered mental status  CT HEAD WITHOUT CONTRAST CT CERVICAL SPINE WITHOUT CONTRAST  Technique:  Multidetector CT imaging of the head and cervical spine was performed following the standard protocol without intravenous contrast.  Multiplanar CT image reconstructions of the cervical spine were also generated.  Comparison:  CT 01/27/2012  CT HEAD  Findings: Moderate atrophy.  Chronic microvascular ischemic change in the white matter.  No acute infarct.  Negative for hemorrhage or mass.  No subdural fluid collection.  Negative for skull fracture.  IMPRESSION: No acute intracranial abnormality.  CT CERVICAL SPINE  Findings: Negative for fracture  Normal alignment.  Mild disc degeneration and spondylosis C4-C7. Mild facet degeneration.  IMPRESSION: Negative for fracture.   Original Report Authenticated By: Janeece Riggers, M.D.   Dg Chest Port 1 View  09/02/2012   *RADIOLOGY REPORT*  Clinical Data: Altered mental status  PORTABLE CHEST - 1 VIEW  Comparison: None  Findings: Mild left lower lobe  atelectasis.  Negative for heart failure or pneumonia.  No mass or effusion.  Severe degenerative change in the shoulder joints bilaterally.  IMPRESSION: Mild left lower lobe atelectasis   Original Report Authenticated By: Janeece Riggers, M.D.    Assessment/Plan Present on Admission:  . Concussion syndrome . HTN (hypertension) . Hypothyroidism  PLAN: Will admit him of OBS.  He likely had a concussion syndrome on top of dementia.  I have continued his meds.   Will obtain EKG and cycle his troponin.  He will be placed on telemetry.  I discussed and confirmed that he is a full code. For his hypothyroidism, will continue synthroid and check TSH.  He is stable and will be admitted to [facility] service.    Other plans as per orders.  Code Status: FULL Unk Lightning, MD. [facility] Pager [phone] 7pm to 7am.  09/03/2012, 12:25 AM

## 2012-09-04 ENCOUNTER — Inpatient Hospital Stay (HOSPITAL_COMMUNITY): Payer: Medicare Other

## 2012-09-04 DIAGNOSIS — M6281 Muscle weakness (generalized): Secondary | ICD-10-CM

## 2012-09-04 DIAGNOSIS — R4701 Aphasia: Secondary | ICD-10-CM | POA: Insufficient documentation

## 2012-09-04 DIAGNOSIS — R4182 Altered mental status, unspecified: Secondary | ICD-10-CM

## 2012-09-04 DIAGNOSIS — I639 Cerebral infarction, unspecified: Secondary | ICD-10-CM | POA: Diagnosis present

## 2012-09-04 LAB — COMPREHENSIVE METABOLIC PANEL
BUN: 15 mg/dL (ref 6–23)
Calcium: 9.3 mg/dL (ref 8.4–10.5)
Creatinine, Ser: 0.92 mg/dL (ref 0.50–1.35)
GFR calc Af Amer: 84 mL/min — ABNORMAL LOW (ref >90)
Glucose, Bld: 110 mg/dL — ABNORMAL HIGH (ref 70–99)
Total Protein: 6 g/dL (ref 6.0–8.3)

## 2012-09-04 LAB — CBC
HCT: 37.7 % — ABNORMAL LOW (ref 39.0–52.0)
Hemoglobin: 13.4 g/dL (ref 13.0–17.0)
MCH: 35 pg — ABNORMAL HIGH (ref 26.0–34.0)
MCHC: 35.5 g/dL (ref 30.0–36.0)

## 2012-09-04 LAB — VITAMIN B12: Vitamin B-12: 734 pg/mL (ref 211–911)

## 2012-09-04 LAB — TSH: TSH: 1.308 u[IU]/mL (ref 0.350–4.500)

## 2012-09-04 LAB — FOLATE: Folate: >20 ng/mL

## 2012-09-04 LAB — MAGNESIUM: Magnesium: 1.8 mg/dL (ref 1.5–2.5)

## 2012-09-04 MED ORDER — ENOXAPARIN SODIUM 40 MG/0.4ML ~~LOC~~ SOLN
40.0000 mg | SUBCUTANEOUS | Status: DC
  Administered 2012-09-04 – 2012-09-13 (×10): 40 mg via SUBCUTANEOUS
  Filled 2012-09-04 (×10): qty 0.4

## 2012-09-04 MED ORDER — PNEUMOCOCCAL VAC POLYVALENT 25 MCG/0.5ML IJ INJ
0.5000 mL | INJECTION | INTRAMUSCULAR | Status: AC
  Filled 2012-09-04: qty 0.5

## 2012-09-04 MED ORDER — MIDAZOLAM HCL 2 MG/2ML IJ SOLN
2.0000 mg | Freq: Once | INTRAMUSCULAR | Status: AC
  Administered 2012-09-04: 2 mg via INTRAMUSCULAR
  Filled 2012-09-04: qty 2

## 2012-09-04 NOTE — Progress Notes (Signed)
EEG Completed; Results Pending  

## 2012-09-04 NOTE — Progress Notes (Addendum)
[facility] PROGRESS NOTE  Dustin Roy WUJ:[id number] DOB: 1920/03/27 DOA: 09/02/2012 PCP: No primary provider on file.  Assessment/Plan: 1. CVA; MRI from 24 June shows a small infarct in the left frontal lobe. The patient cardiac on 81 mg aspirin. Considering patient's age and risk for falls would not add any anticoagulation at this time. Deferred to neurology recommendation. PT/OT to see in AM. Consult son and wife on findings  2. Postconcussive syndrome; per wife patient at baseline except for increased weakness in lower extremities. Obtain PT/OT for evaluation; NOTE patient returned to the floor too late in the day for PT and OT to fully evaluate per patient's wife stated they will return in a.m. 3. dementia; EEG obtained by neurology shows brain pattern of a non-consistent etiology, with dementia being in the differential  4. Vasovagal; counseled family that the episode they witnessed today while patient was performing a BM is consistent with vasovagal 5. Expressive aphasia; consult speech  Code Status: Full  Family Communication: Wife and son, discuss plan of care  Disposition Plan: Per neurology   Consultants: Neurology (Dr. Thad Ranger)   Procedures: EEG from 24 June;This is an abnormal EEG secondary to posterior background slowing. This finding may be seen with a diffuse gray matter disturbance that is etiologically nonspecific, but may include a dementia, among other possibilities.  MRI from 24 June: 1. Small acute white matter lacunar infarct in the left frontal lobe. No mass effect or acute hemorrhage.  2. Mild for age signal changes elsewhere in the brain compatible with chronic small vessel disease.    Antibiotics:  None  HPI/Subjective: Dustin Roy is an 79 y.o. WM PMHx of noticeable dementia which started 4 months ago her wife (was off her Aricept by PCP never made decision on taking, never evaluated by neuro) , HTN, hypothyroidism, lives at home with his  wife, found down in his bathroom. He reportedly hit his head. No seizure activity reported. There has been no complaint of HA, chest pain, nausea, vomiting, palpitation or lightheadedness. Evaluation in the ER included negative head CT and unremarkable serology. No EKG done. Wife stated that he was a little more confused than usual, so hospitalist was asked to admit him for OBS, likely due to post concussion syndrome. TODAY alert and oriented x1 (to person), able to follow commands (no change from yesterday). Wife relates to me that today during episode the patient sitting on the commode performing a BM he had an episode of what appeared to be seizures which lasted just a few seconds. Patient was then able to return to bed with help   Objective: Filed Vitals:   09/03/12 1548 09/03/12 2200 09/04/12 0600 09/04/12 1326  BP: 134/85 137/62 139/65 163/70  Pulse: 93 78 71 84  Temp: 97.2 F (36.2 C) 98.4 F (36.9 C) 98.1 F (36.7 C) 97.3 F (36.3 C)  TempSrc: Oral Oral Oral Axillary  Resp: 20 20 20 20   Height:      Weight:      SpO2: 100% 100% 98% 100%    Intake/Output Summary (Last 24 hours) at 09/04/12 1420 Last data filed at 09/04/12 1328  Gross per 24 hour  Intake      0 ml  Output    200 ml  Net   -200 ml   Filed Weights   09/03/12 0206  Weight: 61.8 kg (136 lb 3.9 oz)    Exam: General: Sitting comfortably in bed confused  Cardiovascular: Regular rhythm and rate, negative murmurs  rubs gallops  Respiratory: Clear to auscultation bilaterally  Abdomen: Soft, nontender, nondistended plus bowel sounds  Neurologic alert and oriented x1 (2 self), unable to answer simple questions (confabulates), INR is 2 through 12 intact, upper extremity/lower extremity strength 5/5, sensation intact. Remainder of exam deferred    Data Reviewed: Basic Metabolic Panel:  Recent Labs Lab 09/02/12 2004 09/04/12 1010  NA 138 135  K 3.4* 3.6  CL 100 97  CO2 28 28  GLUCOSE 115* 110*  BUN 24* 15   CREATININE 1.16 0.92  CALCIUM 9.4 9.3  MG  --  1.8   Liver Function Tests:  Recent Labs Lab 09/02/12 2004 09/04/12 1010  AST 25 21  ALT 16 12  ALKPHOS 120* 120*  BILITOT 0.4 0.5  PROT 6.5 6.0  ALBUMIN 3.5 3.0*   No results found for this basename: LIPASE, AMYLASE,  in the last 168 hours No results found for this basename: AMMONIA,  in the last 168 hours CBC:  Recent Labs Lab 09/02/12 2004 09/04/12 1010  WBC 10.9* 6.1  NEUTROABS 9.4*  --   HGB 12.9* 13.4  HCT 36.8* 37.7*  MCV 98.9 98.4  PLT 181 198   Cardiac Enzymes:  Recent Labs Lab 09/02/12 2004 09/03/12 0052 09/03/12 0650 09/03/12 1228  TROPONINI <0.30 <0.30 <0.30 <0.30   BNP (last 3 results) No results found for this basename: PROBNP,  in the last 8760 hours CBG: No results found for this basename: GLUCAP,  in the last 168 hours  No results found for this or any previous visit (from the past 240 hour(s)).   Studies: Ct Head Wo Contrast  09/02/2012   *RADIOLOGY REPORT*  Clinical Data:  Altered mental status  CT HEAD WITHOUT CONTRAST CT CERVICAL SPINE WITHOUT CONTRAST  Technique:  Multidetector CT imaging of the head and cervical spine was performed following the standard protocol without intravenous contrast.  Multiplanar CT image reconstructions of the cervical spine were also generated.  Comparison:  CT 01/27/2012  CT HEAD  Findings: Moderate atrophy.  Chronic microvascular ischemic change in the white matter.  No acute infarct.  Negative for hemorrhage or mass.  No subdural fluid collection.  Negative for skull fracture.  IMPRESSION: No acute intracranial abnormality.  CT CERVICAL SPINE  Findings: Negative for fracture  Normal alignment.  Mild disc degeneration and spondylosis C4-C7. Mild facet degeneration.  IMPRESSION: Negative for fracture.   Original Report Authenticated By: Janeece Riggers, M.D.   Ct Cervical Spine Wo Contrast  09/02/2012   *RADIOLOGY REPORT*  Clinical Data:  Altered mental status  CT  HEAD WITHOUT CONTRAST CT CERVICAL SPINE WITHOUT CONTRAST  Technique:  Multidetector CT imaging of the head and cervical spine was performed following the standard protocol without intravenous contrast.  Multiplanar CT image reconstructions of the cervical spine were also generated.  Comparison:  CT 01/27/2012  CT HEAD  Findings: Moderate atrophy.  Chronic microvascular ischemic change in the white matter.  No acute infarct.  Negative for hemorrhage or mass.  No subdural fluid collection.  Negative for skull fracture.  IMPRESSION: No acute intracranial abnormality.  CT CERVICAL SPINE  Findings: Negative for fracture  Normal alignment.  Mild disc degeneration and spondylosis C4-C7. Mild facet degeneration.  IMPRESSION: Negative for fracture.   Original Report Authenticated By: Janeece Riggers, M.D.   Dg Chest Port 1 View  09/02/2012   *RADIOLOGY REPORT*  Clinical Data: Altered mental status  PORTABLE CHEST - 1 VIEW  Comparison: None  Findings: Mild left lower lobe  atelectasis.  Negative for heart failure or pneumonia.  No mass or effusion.  Severe degenerative change in the shoulder joints bilaterally.  IMPRESSION: Mild left lower lobe atelectasis   Original Report Authenticated By: Janeece Riggers, M.D.    Scheduled Meds: . aspirin EC  81 mg Oral Daily  . enoxaparin (LOVENOX) injection  40 mg Subcutaneous Q24H  . levothyroxine  50 mcg Oral QAC breakfast  . LORazepam  0.5 mg Intravenous Once  . [START ON 09/05/2012] pneumococcal 23 valent vaccine  0.5 mL Intramuscular Tomorrow-1000  . triamterene-hydrochlorothiazide  1 tablet Oral Daily   Continuous Infusions:   Principal Problem:   Concussion syndrome Active Problems:   Fall at home   HTN (hypertension)   Hypothyroidism    Time spent: 30 minutes    Rosalin Buster, J  [facility] Pager [phone]. If 7PM-7AM, please contact night-coverage at [email or web address], password [facility] 09/04/2012, 2:20 PM  LOS: 2 days

## 2012-09-04 NOTE — Progress Notes (Signed)
PT Cancellation Note  Patient Details Name: Dustin Roy MRN: [id number] DOB: Jan 25, 1921   Cancelled Treatment:    Reason Eval/Treat Not Completed: Patient at procedure or test/unavailable (pt in MRI).  Pt still out of room at tests.  I was able to get history from wife, but pt is not back.  PT to check back in AM.     Lurena Joiner B. Stevan Eberwein, PT, DPT [phone]   09/04/2012, 4:39 PM

## 2012-09-04 NOTE — Consult Note (Signed)
NEURO HOSPITALIST CONSULT NOTE    Reason for Consult:Dementia  HPI:                                                                                                                                          Dustin Roy is an 79 y.o. male who has been having declining memory over the last year and especially the last 6 months per wife. He has been seen as a out patient by his PCP for his declining memory and offered a trial of Aricept.  His wife was not sure about starting this medication due to fear of some of the side effects.  He does have known B12 deficiency which he received one injection for but, per wife, this did not have a large effect and his levels did not increase. 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 Over the past two weeks his wife has noted his speech has become less coherent (at times normal and other times making no sense).  Over the past two days she cannot understand what he is saying. Patient is in the hospital due to falling in his bathroom and having contusion of his head. Initial CT head showed moderate atrophy and no blood. Neurology was asked to see patient for evaluation of Dementia and initiating Aricept.   Past Medical History  Diagnosis Date  . Hypertension   . Hypothyroid     History reviewed. No pertinent past surgical history.   Family History: Mother HTN,   Father HTN, hyperlipidemia  Social History:  reports that he has never smoked. He does not have any smokeless tobacco history on file. His alcohol and drug histories are not on file.  Allergies  Allergen Reactions  . Amoxicillin Other (See Comments)    Hallucinations; fever    MEDICATIONS:                                                                                                                     Scheduled: . aspirin EC  81 mg Oral Daily  . enoxaparin (LOVENOX) injection  40 mg Subcutaneous Q24H  . levothyroxine   50 mcg Oral QAC breakfast  . LORazepam  0.5 mg Intravenous Once  . [START  ON 09/05/2012] pneumococcal 23 valent vaccine  0.5 mL Intramuscular Tomorrow-1000  . triamterene-hydrochlorothiazide  1 tablet Oral Daily     ROS:                                                                                                                                       History obtained from wife  General ROS: positive for - decreased memory Psychological ROS: negative for - behavioral disorder, hallucinations, memory difficulties, mood swings or suicidal ideation Ophthalmic ROS: negative for - blurry vision, double vision, eye pain or loss of vision ENT ROS: negative for - epistaxis, nasal discharge, oral lesions, sore throat, tinnitus or vertigo Allergy and Immunology ROS: negative for - hives or itchy/watery eyes Hematological and Lymphatic ROS: negative for - bleeding problems, bruising or swollen lymph nodes Endocrine ROS: negative for - galactorrhea, hair pattern changes, polydipsia/polyuria or temperature intolerance Respiratory ROS: negative for - cough, hemoptysis, shortness of breath or wheezing Cardiovascular ROS: negative for - chest pain, dyspnea on exertion, edema or irregular heartbeat Gastrointestinal ROS: negative for - abdominal pain, diarrhea, hematemesis, nausea/vomiting or stool incontinence Genito-Urinary ROS: negative for - dysuria, hematuria, incontinence or urinary frequency/urgency Musculoskeletal ROS: negative for - joint swelling or muscular weakness Neurological ROS: as noted in HPI Dermatological ROS: negative for rash and skin lesion changes   Blood pressure 139/65, pulse 71, temperature 98.1 F (36.7 C), temperature source Oral, resp. rate 20, height 5\' 9"  (1.753 m), weight 61.8 kg (136 lb 3.9 oz), SpO2 98.00%.   Neurologic Examination:                                                                                                      Mental Status: Alert, looks toward  voice, follows no commands verbal or visual.  Patient speaks but makes no recognizable words.   Cranial Nerves: II: Discs flat bilaterally; Visual fields blinks to threat and tracks movements in room, pupils equal, round, reactive to light and accommodation III,IV, VI: ptosis not present, extra-ocular motions intact bilaterally V,VII: face symmetric, facial light touch sensation normal bilaterally VIII: hearing normal bilaterally IX,X: gag reflex present XI: bilateral shoulder shrug XII: midline tongue extension --positive glabbela and snout response Motor: Left UE antigravity, Right UE able to lift antigravity but does show a drift, bilateral LE withdraws antigravity to pain.  --positive palmomental bilaterally Tone and bulk:normal tone throughout; no atrophy noted Sensory: Pinprick and light touch intact throughout, bilaterally Deep Tendon Reflexes:  Right: Upper Extremity  Left: Upper extremity   biceps (C-5 to C-6) 2/4   biceps (C-5 to C-6) 2/4 tricep (C7) 2/4    triceps (C7) 2/4 Brachioradialis (C6) 2/4  Brachioradialis (C6) 2/4  Lower Extremity Lower Extremity  quadriceps (L-2 to L-4) 1/4   quadriceps (L-2 to L-4) 1/4 Achilles (S1) 0/4   Achilles (S1) 0/4  Plantars: Right: equivocal   Left: equivocal Cerebellar: Not able to assess as patient does not follow commands Gait: Unable to assess CV: pulses palpable throughout    No results found for this basename: cbc, bmp, coags, chol, tri, ldl, hga1c    Results for orders placed during the hospital encounter of 09/02/12 (from the past 48 hour(s))  CBC WITH DIFFERENTIAL     Status: Abnormal   Collection Time    09/02/12  8:04 PM      Result Value Range   WBC 10.9 (*) 4.0 - 10.5 K/uL   RBC 3.72 (*) 4.22 - 5.81 MIL/uL   Hemoglobin 12.9 (*) 13.0 - 17.0 g/dL   HCT 40.9 (*) 81.1 - 91.4 %   MCV 98.9  78.0 - 100.0 fL   MCH 34.7 (*) 26.0 - 34.0 pg   MCHC 35.1  30.0 - 36.0 g/dL   RDW 78.2  95.6 - 21.3 %   Platelets 181  150  - 400 K/uL   Neutrophils Relative % 86 (*) 43 - 77 %   Neutro Abs 9.4 (*) 1.7 - 7.7 K/uL   Lymphocytes Relative 9 (*) 12 - 46 %   Lymphs Abs 0.9  0.7 - 4.0 K/uL   Monocytes Relative 5  3 - 12 %   Monocytes Absolute 0.5  0.1 - 1.0 K/uL   Eosinophils Relative 1  0 - 5 %   Eosinophils Absolute 0.1  0.0 - 0.7 K/uL   Basophils Relative 0  0 - 1 %   Basophils Absolute 0.0  0.0 - 0.1 K/uL  COMPREHENSIVE METABOLIC PANEL     Status: Abnormal   Collection Time    09/02/12  8:04 PM      Result Value Range   Sodium 138  135 - 145 mEq/L   Potassium 3.4 (*) 3.5 - 5.1 mEq/L   Chloride 100  96 - 112 mEq/L   CO2 28  19 - 32 mEq/L   Glucose, Bld 115 (*) 70 - 99 mg/dL   BUN 24 (*) 6 - 23 mg/dL   Creatinine, Ser 0.86  0.50 - 1.35 mg/dL   Calcium 9.4  8.4 - 57.8 mg/dL   Total Protein 6.5  6.0 - 8.3 g/dL   Albumin 3.5  3.5 - 5.2 g/dL   AST 25  0 - 37 U/L   ALT 16  0 - 53 U/L   Alkaline Phosphatase 120 (*) 39 - 117 U/L   Total Bilirubin 0.4  0.3 - 1.2 mg/dL   GFR calc non Af Amer 54 (*) >90 mL/min   GFR calc Af Amer 62 (*) >90 mL/min   Comment:            The eGFR has been calculated     using the CKD EPI equation.     This calculation has not been     validated in all clinical     situations.     eGFR's persistently     <90 mL/min signify     possible Chronic Kidney Disease.  TROPONIN I     Status: None   Collection Time  09/02/12  8:04 PM      Result Value Range   Troponin I <0.30  <0.30 ng/mL   Comment:            Due to the release kinetics of cTnI,     a negative result within the first hours     of the onset of symptoms does not rule out     myocardial infarction with certainty.     If myocardial infarction is still suspected,     repeat the test at appropriate intervals.  PROTIME-INR     Status: None   Collection Time    09/02/12  8:04 PM      Result Value Range   Prothrombin Time 13.2  11.6 - 15.2 seconds   INR 1.01  0.00 - 1.49  APTT     Status: None   Collection Time     09/02/12  8:04 PM      Result Value Range   aPTT 26  24 - 37 seconds  URINALYSIS, ROUTINE W REFLEX MICROSCOPIC     Status: Abnormal   Collection Time    09/02/12  9:04 PM      Result Value Range   Color, Urine YELLOW  YELLOW   APPearance CLOUDY (*) CLEAR   Specific Gravity, Urine 1.022  1.005 - 1.030   pH 5.5  5.0 - 8.0   Glucose, UA NEGATIVE  NEGATIVE mg/dL   Hgb urine dipstick TRACE (*) NEGATIVE   Bilirubin Urine NEGATIVE  NEGATIVE   Ketones, ur NEGATIVE  NEGATIVE mg/dL   Protein, ur NEGATIVE  NEGATIVE mg/dL   Urobilinogen, UA 0.2  0.0 - 1.0 mg/dL   Nitrite NEGATIVE  NEGATIVE   Leukocytes, UA NEGATIVE  NEGATIVE  URINE MICROSCOPIC-ADD ON     Status: Abnormal   Collection Time    09/02/12  9:04 PM      Result Value Range   Squamous Epithelial / LPF FEW (*) RARE   WBC, UA 0-2  <3 WBC/hpf   RBC / HPF 0-2  <3 RBC/hpf   Bacteria, UA FEW (*) RARE   Casts HYALINE CASTS (*) NEGATIVE  TROPONIN I     Status: None   Collection Time    09/03/12 12:52 AM      Result Value Range   Troponin I <0.30  <0.30 ng/mL   Comment:            Due to the release kinetics of cTnI,     a negative result within the first hours     of the onset of symptoms does not rule out     myocardial infarction with certainty.     If myocardial infarction is still suspected,     repeat the test at appropriate intervals.  TROPONIN I     Status: None   Collection Time    09/03/12  6:50 AM      Result Value Range   Troponin I <0.30  <0.30 ng/mL   Comment:            Due to the release kinetics of cTnI,     a negative result within the first hours     of the onset of symptoms does not rule out     myocardial infarction with certainty.     If myocardial infarction is still suspected,     repeat the test at appropriate intervals.  TSH     Status: None   Collection Time  09/03/12  6:50 AM      Result Value Range   TSH 1.297  0.350 - 4.500 uIU/mL  TROPONIN I     Status: None   Collection Time     09/03/12 12:28 PM      Result Value Range   Troponin I <0.30  <0.30 ng/mL   Comment:            Due to the release kinetics of cTnI,     a negative result within the first hours     of the onset of symptoms does not rule out     myocardial infarction with certainty.     If myocardial infarction is still suspected,     repeat the test at appropriate intervals.  CBC     Status: Abnormal   Collection Time    09/04/12 10:10 AM      Result Value Range   WBC 6.1  4.0 - 10.5 K/uL   RBC 3.83 (*) 4.22 - 5.81 MIL/uL   Hemoglobin 13.4  13.0 - 17.0 g/dL   HCT 16.1 (*) 09.6 - 04.5 %   MCV 98.4  78.0 - 100.0 fL   MCH 35.0 (*) 26.0 - 34.0 pg   MCHC 35.5  30.0 - 36.0 g/dL   RDW 40.9  81.1 - 91.4 %   Platelets 198  150 - 400 K/uL  COMPREHENSIVE METABOLIC PANEL     Status: Abnormal   Collection Time    09/04/12 10:10 AM      Result Value Range   Sodium 135  135 - 145 mEq/L   Potassium 3.6  3.5 - 5.1 mEq/L   Chloride 97  96 - 112 mEq/L   CO2 28  19 - 32 mEq/L   Glucose, Bld 110 (*) 70 - 99 mg/dL   BUN 15  6 - 23 mg/dL   Creatinine, Ser 7.82  0.50 - 1.35 mg/dL   Calcium 9.3  8.4 - 95.6 mg/dL   Total Protein 6.0  6.0 - 8.3 g/dL   Albumin 3.0 (*) 3.5 - 5.2 g/dL   AST 21  0 - 37 U/L   ALT 12  0 - 53 U/L   Alkaline Phosphatase 120 (*) 39 - 117 U/L   Total Bilirubin 0.5  0.3 - 1.2 mg/dL   GFR calc non Af Amer 72 (*) >90 mL/min   GFR calc Af Amer 84 (*) >90 mL/min   Comment:            The eGFR has been calculated     using the CKD EPI equation.     This calculation has not been     validated in all clinical     situations.     eGFR's persistently     <90 mL/min signify     possible Chronic Kidney Disease.  MAGNESIUM     Status: None   Collection Time    09/04/12 10:10 AM      Result Value Range   Magnesium 1.8  1.5 - 2.5 mg/dL    Ct Head Wo Contrast  09/02/2012   *RADIOLOGY REPORT*  Clinical Data:  Altered mental status  CT HEAD WITHOUT CONTRAST CT CERVICAL SPINE WITHOUT CONTRAST   Technique:  Multidetector CT imaging of the head and cervical spine was performed following the standard protocol without intravenous contrast.  Multiplanar CT image reconstructions of the cervical spine were also generated.  Comparison:  CT 01/27/2012  CT HEAD  Findings: Moderate atrophy.  Chronic microvascular ischemic change in the white matter.  No acute infarct.  Negative for hemorrhage or mass.  No subdural fluid collection.  Negative for skull fracture.  IMPRESSION: No acute intracranial abnormality.  CT CERVICAL SPINE  Findings: Negative for fracture  Normal alignment.  Mild disc degeneration and spondylosis C4-C7. Mild facet degeneration.  IMPRESSION: Negative for fracture.   Original Report Authenticated By: Janeece Riggers, M.D.   Ct Cervical Spine Wo Contrast  09/02/2012   *RADIOLOGY REPORT*  Clinical Data:  Altered mental status  CT HEAD WITHOUT CONTRAST CT CERVICAL SPINE WITHOUT CONTRAST  Technique:  Multidetector CT imaging of the head and cervical spine was performed following the standard protocol without intravenous contrast.  Multiplanar CT image reconstructions of the cervical spine were also generated.  Comparison:  CT 01/27/2012  CT HEAD  Findings: Moderate atrophy.  Chronic microvascular ischemic change in the white matter.  No acute infarct.  Negative for hemorrhage or mass.  No subdural fluid collection.  Negative for skull fracture.  IMPRESSION: No acute intracranial abnormality.  CT CERVICAL SPINE  Findings: Negative for fracture  Normal alignment.  Mild disc degeneration and spondylosis C4-C7. Mild facet degeneration.  IMPRESSION: Negative for fracture.   Original Report Authenticated By: Janeece Riggers, M.D.   Dg Chest Port 1 View  09/02/2012   *RADIOLOGY REPORT*  Clinical Data: Altered mental status  PORTABLE CHEST - 1 VIEW  Comparison: None  Findings: Mild left lower lobe atelectasis.  Negative for heart failure or pneumonia.  No mass or effusion.  Severe degenerative change in the  shoulder joints bilaterally.  IMPRESSION: Mild left lower lobe atelectasis   Original Report Authenticated By: Janeece Riggers, M.D.   Felicie Morn PA-C [facility] [phone]  09/04/2012, 1:12 PM   Patient seen and examined.  Clinical course and management discussed.  Necessary edits performed.  I agree with the above.  Assessment and plan of care developed and discussed below.    Assessment/Plan: 79 year old male with a history consistent with a dementia.  Patient offered Aricept as an outpatient.  Family not sure if they want to start this medication.  From further conversation it does not seem that the patient has received a full dementia work up either which may be helpful.   Recommendations: 1.  B12, folate, TSH, ESR, RPR 2.  EEG 3.  MRI of the brain without contrast 4.  Family may continue to consider Aricept which can always be initiated as an outpatient   Thana Farr, MD [facility] [phone]  09/04/2012  3:49 PM  Addendum: Patient with a syncopal episode while on the toilet today.  Back to baseline quickly.  Likely vasovagal.  No further work up recommended.  Thana Farr, MD [facility] [phone]

## 2012-09-04 NOTE — Procedures (Signed)
ELECTROENCEPHALOGRAM REPORT   Patient: Dustin Roy       Room #: 1O10 EEG No. ID: [id number] Age: 79 y.o.        Sex: male Referring Physician: Joseph Art Report Date:  09/04/2012        Interpreting Physician: Thana Farr D  History: Dustin Roy is an 79 y.o. male with altered mental status after a fall  Medications:  Scheduled: . aspirin EC  81 mg Oral Daily  . enoxaparin (LOVENOX) injection  40 mg Subcutaneous Q24H  . levothyroxine  50 mcg Oral QAC breakfast  . [START ON 09/05/2012] pneumococcal 23 valent vaccine  0.5 mL Intramuscular Tomorrow-1000  . triamterene-hydrochlorothiazide  1 tablet Oral Daily    Conditions of Recording:  This is a 16 channel EEG carried out with the patient in the awake, drowsy and asleep states.  Description:  The waking background activity consists of a low voltage, symmetrical, fairly well organized, predominantly 7-8 Hz theta activity, seen from the parieto-occipital and posterior temporal regions.  Low voltage fast activity, poorly organized, is seen anteriorly and is at times superimposed on more posterior regions.  A mixture of theta and alpha rhythms are seen from the central and temporal regions. The patient drowses with slowing to irregular, low voltage theta and beta activity.   The patient goes in to a light sleep with symmetrical sleep spindles, vertex central sharp transients and irregular slow activity.   Hyperventilation and intermittent photic stimulation were not performed.    IMPRESSION: This is an abnormal EEG secondary to posterior background slowing.  This finding may be seen with a diffuse gray matter disturbance that is etiologically nonspecific, but may include a dementia, among other possibilities.     Thana Farr, MD [facility] [phone] 09/04/2012, 5:24 PM

## 2012-09-04 NOTE — Progress Notes (Signed)
Around 1315 pt was assisted to commode. While pt having BM he had a witnessed syncopal event where he lost consciousness, body slumped forward, eyes rolled back with tonic clonic jerking motions. Pt returned back to baseline after 45 sec but would not follow commands (he would not follow commands prior either). Vitals post event were BP 163/70, HR 84, 100% RA. Examined pts mouth and he was bleeding from his lip where he had bit it. Pt was assisted back to bed where he fell asleep shortly after. Neuro PA notified, no new orders received. Will continue to monitor.

## 2012-09-04 NOTE — Progress Notes (Signed)
OT Cancellation Note  Patient Details Name: Dustin Roy MRN: [id number] DOB: 1920/12/23   Cancelled Treatment:    Reason Eval/Treat Not Completed: Patient at procedure or test/ unavailable. Will follow up as able.   Earlie Raveling OTR/L [phone] 09/04/2012, 4:09 PM

## 2012-09-04 NOTE — Progress Notes (Signed)
PT Cancellation Note  Patient Details Name: Dustin Roy MRN: [id number] DOB: October 05, 1920   Cancelled Treatment:    Reason Eval/Treat Not Completed: Patient at procedure or test/unavailable. Will f/u as able.   [facility] HELEN 09/04/2012, 3:33 PM

## 2012-09-05 ENCOUNTER — Inpatient Hospital Stay (HOSPITAL_COMMUNITY): Payer: Medicare Other

## 2012-09-05 DIAGNOSIS — Y92009 Unspecified place in unspecified non-institutional (private) residence as the place of occurrence of the external cause: Secondary | ICD-10-CM

## 2012-09-05 DIAGNOSIS — R569 Unspecified convulsions: Secondary | ICD-10-CM

## 2012-09-05 DIAGNOSIS — F0390 Unspecified dementia without behavioral disturbance: Secondary | ICD-10-CM | POA: Diagnosis present

## 2012-09-05 DIAGNOSIS — I6350 Cerebral infarction due to unspecified occlusion or stenosis of unspecified cerebral artery: Principal | ICD-10-CM

## 2012-09-05 LAB — URINE CULTURE

## 2012-09-05 MED ORDER — SODIUM CHLORIDE 0.9 % IV SOLN
500.0000 mg | INTRAVENOUS | Status: AC
  Administered 2012-09-05: 500 mg via INTRAVENOUS
  Filled 2012-09-05: qty 5

## 2012-09-05 MED ORDER — SODIUM CHLORIDE 0.9 % IV SOLN
500.0000 mg | Freq: Two times a day (BID) | INTRAVENOUS | Status: DC
  Administered 2012-09-05 – 2012-09-06 (×2): 500 mg via INTRAVENOUS
  Filled 2012-09-05 (×3): qty 5

## 2012-09-05 NOTE — Progress Notes (Signed)
OT Cancellation Note  Patient Details Name: Dustin Roy MRN: [id number] DOB: [birth year]   Cancelled Treatment:    Reason Eval/Treat Not Completed: Medical issues which prohibited therapy. Spoke with PT and pt had seizure this am. Will f/u as able.   Earlie Raveling OTR/L [phone] 09/05/2012, 4:35 PM

## 2012-09-05 NOTE — Progress Notes (Signed)
Around 838am sitter notified RN that pt was having a seizure in the bed. RN found pt with arms jerking and stiff, eyes gazed, non-responsive, pupils 2 slow reacting, bit his tongue. This episode lasted approx 3 minutes, during it HR 130s BP 201/81 immediately afterwards, placed pt on non-rebreather mask for O2 support 100% sat on 3L. Post ictal deep sleep,not following commands, will not open eyes, arms and legs still stiff contracted. Vitals 3 min later were 167/71 HR 80s 100% 3L, now BP is down to 138/75. Dr Lendell Caprice [facility] notified and at bedside with new orders. Neuro PA notified of seizure, was asked to call stroke team so paged [facility]. Will continue to monitor. Sitter still at bedside.

## 2012-09-05 NOTE — Evaluation (Signed)
SLP Cancellation Note  Patient Details Name: Dustin Roy MRN: [id number] DOB: 12/11/1920   Cancelled treatment:       Reason Eval/Treat Not Completed: Patient at procedure or test/unavailable  Pt at CT currently.  SLP to return as ordered for evaluation at later time.     Donavan Burnet, MS [facility] SLP [phone]

## 2012-09-05 NOTE — Progress Notes (Signed)
Stroke Team Progress Note  HISTORY  Dustin Roy is an 79 y.o. male who has been having declining memory over the last year and especially the last 6 months per wife. He has been seen as a out patient by his PCP for his declining memory and offered a trial of Aricept. His wife was not sure about starting this medication due to fear of some of the side effects. He does have known B12 deficiency which he received one injection for but, per wife, this did not have a large effect and his levels did not increase. 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 Over the past two weeks his wife has noted his speech has become less coherent (at times normal and other times making no sense). Over the past two days she cannot understand what he is saying. Patient is in the hospital due to falling in his bathroom and having contusion of his head. Initial CT head showed moderate atrophy and no blood. Neurology was asked to see patient for evaluation of Dementia and initiating Aricept.   SUBJECTIVE  Patient had episode this am where he was lying down and arching of his arms witnessed by his wife. Sleepy afterwards. Somnolent now.  She describes a fall at home  which could have been time of infarct just before patient came to hospital.   OBJECTIVE Most recent Vital Signs: Filed Vitals:   09/04/12 0600 09/04/12 1326 09/04/12 2157 09/05/12 0536  BP: 139/65 163/70 139/71 132/68  Pulse: 71 84 78 78  Temp: 98.1 F (36.7 C) 97.3 F (36.3 C) 97.9 F (36.6 C) 98 F (36.7 C)  TempSrc: Oral Axillary Axillary Oral  Resp: 20 20 18 16   Height:      Weight:      SpO2: 98% 100% 100% 97%   CBG (last 3)  No results found for this basename: GLUCAP,  in the last 72 hours  IV Fluid Intake:     MEDICATIONS  . aspirin EC  81 mg Oral Daily  . enoxaparin (LOVENOX) injection  40 mg Subcutaneous Q24H  . levETIRAcetam  500 mg Intravenous Q12H  . levothyroxine  50 mcg Oral  QAC breakfast  . pneumococcal 23 valent vaccine  0.5 mL Intramuscular Tomorrow-1000  . triamterene-hydrochlorothiazide  1 tablet Oral Daily   PRN:    Diet:  NPO   Activity:  Bedrest DVT Prophylaxis:  lovenox  CLINICALLY SIGNIFICANT STUDIES Basic Metabolic Panel:  Recent Labs Lab 09/02/12 2004 09/04/12 1010  NA 138 135  K 3.4* 3.6  CL 100 97  CO2 28 28  GLUCOSE 115* 110*  BUN 24* 15  CREATININE 1.16 0.92  CALCIUM 9.4 9.3  MG  --  1.8   Liver Function Tests:  Recent Labs Lab 09/02/12 2004 09/04/12 1010  AST 25 21  ALT 16 12  ALKPHOS 120* 120*  BILITOT 0.4 0.5  PROT 6.5 6.0  ALBUMIN 3.5 3.0*   CBC:  Recent Labs Lab 09/02/12 2004 09/04/12 1010  WBC 10.9* 6.1  NEUTROABS 9.4*  --   HGB 12.9* 13.4  HCT 36.8* 37.7*  MCV 98.9 98.4  PLT 181 198   Coagulation:  Recent Labs Lab 09/02/12 2004  LABPROT 13.2  INR 1.01   Cardiac Enzymes:  Recent Labs Lab 09/03/12 0052 09/03/12 0650 09/03/12 1228  TROPONINI <0.30 <0.30 <0.30   Urinalysis:  Recent Labs Lab 09/02/12 2104  COLORURINE YELLOW  LABSPEC 1.022  PHURINE  5.5  GLUCOSEU NEGATIVE  HGBUR TRACE*  BILIRUBINUR NEGATIVE  KETONESUR NEGATIVE  PROTEINUR NEGATIVE  UROBILINOGEN 0.2  NITRITE NEGATIVE  LEUKOCYTESUR NEGATIVE   Lipid Panel No results found for this basename: chol, trig, hdl, cholhdl, vldl, ldlcalc   HgbA1C  No results found for this basename: HGBA1C    Urine Drug Screen:   No results found for this basename: labopia, cocainscrnur, labbenz, amphetmu, thcu, labbarb    Alcohol Level: No results found for this basename: ETH,  in the last 168 hours  Dustin Brain Wo Contrast 09/04/2012  1.  Small acute white matter lacunar infarct in the left  frontal lobe.  No mass effect or acute hemorrhage. 2.  Mild for age signal changes elsewhere in the brain compatible with chronic small vessel disease.     CT of the brain  Advanced atrophy and microvascular ischemic disease without acute intracranial  process.   MRA of the brain    2D Echocardiogram    Carotid Doppler    CXR    EEG This is an abnormal EEG secondary to posterior background slowing. This finding may be seen with a diffuse gray matter disturbance that is etiologicallynonspecific, but may include a dementia, among other possibilities.   EKG  paced.   Therapy Recommendations patient to sleepy for therapy  Physical Exam    Neurologic Examination:  Mental Status:  Somnolent. Patient speaks but makes no recognizable words. Patient had seizure earlier. Still felt to be in the post ictal phase. Cranial Nerves:  II: Discs flat bilaterally; Visual fields blinks to threat and tracks movements in room, pupils equal, round, reactive to light and accommodation  III,IV, VI: ptosis not present, extra-ocular motions intact bilaterally  V,VII: face symmetric, facial light touch sensation normal bilaterally  VIII: hearing normal bilaterally  IX,X: gag reflex present  XI: bilateral shoulder shrug  XII: midline tongue extension  Motor:  Spontaneous movements bilaterally, withdraws to pain Plantars:  Right: equivocal Left: equivocal  Cerebellar:  Not able to assess as patient does not follow commands  Gait: Unable to assess   ASSESSMENT Dustin. VAHE Roy is a 79 y.o. male presenting with fall in bathroom and confusion. Initial scan showed small left lacunar infarct in frontal lobe. Patient has since had an episode of where he was arching his arms and was non responsive in the room, witnessed by wife and staff; felt to be a seizure. History of dementia and currently still with post ictal confusion probably exacerbated by the dementia. Repeat CT today did not show an acute stroke.  On aspirin 81 mg orally every day prior to admission. Now on aspirin 81 mg orally every day for secondary stroke prevention. Work up underway.   Hypertension  Hyperlipidemia  Long term medication use  Dementia  seizure  Hospital day #  3  TREATMENT/PLAN  Continue aspirin 81 mg orally every day for secondary stroke prevention.  Patient started on keppra 500mg  Q 12 hours with no further episodes  His initial episode was felt to be stroke in nature with his further episodes more notable for seizures.  Echo/carotids  Therapy evaluations when patient is more alert.  Gwendolyn Lima. Manson Passey, [facility], MBA, MHA [facility] Pager: [phone] 09/05/2012 12:35 PM  I have personally obtained a history, examined the patient, evaluated imaging results, and formulated the assessment and plan of care. I agree with the above. Likely had stroke/seizure at home. Needs levetiracetam. DNR status confirmed.   Suanne Marker, MD 09/05/2012,  10:42 PM Certified in Neurology, Neurophysiology and Neuroimaging [facility] - Stroke Team  Please refer to [email or web address] for on-call Stroke MD

## 2012-09-05 NOTE — Progress Notes (Signed)
Called to bedside regarding tonic clonic activity.  Was reportedly alert this morning. Ate Breakfast. Pointed at light, then started stuttering and tonic clonic activity.  No lip or tongue lacerations. No incontinence. Lasted "a few minutes" per tech.  Now resolved. No previous seizures per wife. Chart reviewed.  [facility] PROGRESS NOTE  Dustin Roy OZH:[id number] DOB: 1920-10-30 DOA: 09/02/2012 PCP: No primary provider on file.  Assessment/Plan:  New seizure: repeat CT brain.  NPO.  Seizure precautions.  Neuro checks.   focus from CVA.  Will likely need AED. Defer to neuro.  Had EEG yesterday. Showed slowing.  Initial presentation: found on floor.  Could have had unwitnessed seizure  CVA; MRI from 24 June shows a small infarct in the left frontal lobe. The patient cardiac on 81 mg aspirin. Defer to neurology recommendation.   dementia;   Vasovagal episode yesterday  Discussed code status with wife:  DNR  Code Status: DNR Family Communication: Wife at bedside Disposition Plan: ?   Consultants: Neurology   Procedures: EEG from 24 June;This is an abnormal EEG secondary to posterior background slowing. This finding may be seen with a diffuse gray matter disturbance that is etiologically nonspecific, but may include a dementia, among other possibilities.  MRI from 24 June: 1. Small acute white matter lacunar infarct in the left frontal lobe. No mass effect or acute hemorrhage.  2. Mild for age signal changes elsewhere in the brain compatible with chronic small vessel disease.  Antibiotics:  None  HPI/Subjective: Dustin Roy is an 79 y.o. WM PMHx of noticeable dementia which started 4 months ago her wife (was off her Aricept by PCP never made decision on taking, never evaluated by neuro) , HTN, hypothyroidism, lives at home with his wife, found down in his bathroom. He reportedly hit his head. No seizure activity reported. There has been no complaint of HA, chest  pain, nausea, vomiting, palpitation or lightheadedness. Evaluation in the ER included negative head CT and unremarkable serology. No EKG done. Wife stated that he was a little more confused than usual, so hospitalist was asked to admit him for OBS  Objective: Filed Vitals:   09/04/12 0600 09/04/12 1326 09/04/12 2157 09/05/12 0536  BP: 139/65 163/70 139/71 132/68  Pulse: 71 84 78 78  Temp: 98.1 F (36.7 C) 97.3 F (36.3 C) 97.9 F (36.6 C) 98 F (36.7 C)  TempSrc: Oral Axillary Axillary Oral  Resp: 20 20 18 16   Height:      Weight:      SpO2: 98% 100% 100% 97%    Intake/Output Summary (Last 24 hours) at 09/05/12 0930 Last data filed at 09/04/12 1328  Gross per 24 hour  Intake      0 ml  Output    200 ml  Net   -200 ml   Filed Weights   09/03/12 0206  Weight: 61.8 kg (136 lb 3.9 oz)    Exam: General: unresponsive.  Snoring.   HEENT:  PERRL Cardiovascular: Regular rhythm and rate, negative murmurs rubs gallops  Respiratory: Clear to auscultation bilaterally  Abdomen: Soft, nontender, nondistended plus bowel sounds  Neurologic no obvious CN defecits.  Increased tone in arms and hands.  Data Reviewed: Basic Metabolic Panel:  Recent Labs Lab 09/02/12 2004 09/04/12 1010  NA 138 135  K 3.4* 3.6  CL 100 97  CO2 28 28  GLUCOSE 115* 110*  BUN 24* 15  CREATININE 1.16 0.92  CALCIUM 9.4 9.3  MG  --  1.8  Liver Function Tests:  Recent Labs Lab 09/02/12 2004 09/04/12 1010  AST 25 21  ALT 16 12  ALKPHOS 120* 120*  BILITOT 0.4 0.5  PROT 6.5 6.0  ALBUMIN 3.5 3.0*   No results found for this basename: LIPASE, AMYLASE,  in the last 168 hours No results found for this basename: AMMONIA,  in the last 168 hours CBC:  Recent Labs Lab 09/02/12 2004 09/04/12 1010  WBC 10.9* 6.1  NEUTROABS 9.4*  --   HGB 12.9* 13.4  HCT 36.8* 37.7*  MCV 98.9 98.4  PLT 181 198   Cardiac Enzymes:  Recent Labs Lab 09/02/12 2004 09/03/12 0052 09/03/12 0650 09/03/12 1228   TROPONINI <0.30 <0.30 <0.30 <0.30   BNP (last 3 results) No results found for this basename: PROBNP,  in the last 8760 hours CBG: No results found for this basename: GLUCAP,  in the last 168 hours  No results found for this or any previous visit (from the past 240 hour(s)).   Studies: Mr Sherrin Daisy Contrast  09/04/2012   *RADIOLOGY REPORT*  Clinical Data: 79 year old male with increased confusion, found down.  Possible CVA.  MRI HEAD WITHOUT CONTRAST  Technique:  Multiplanar, multiecho pulse sequences of the brain and surrounding structures were obtained according to standard protocol without intravenous contrast.  Comparison: Head CTs 09/02/2012 and earlier.  Findings: There is a small ( 5-6 mm) area of subcortical white matter restricted diffusion in the left hemisphere near the body of the left lateral ventricle.  Mild associated T2 and FLAIR hyperintensity. No associated mass effect or acute hemorrhage.  No other restricted diffusion identified. Major intracranial vascular flow voids are preserved. No midline shift, mass effect, or evidence of mass lesion. Mild, patchy T2 and FLAIR hyperintensity in the bilateral cerebral white matter outside of the area of diffusion abnormality. There are, there are occasional chronic micro hemorrhages in the brain (right cerebellar hemisphere series 7 image 5).  Mild ex vacuo related enlargement of the ventricles.  Mild for age deep gray matter nuclei and brainstem T2 heterogeneity. Cerebellum within normal limits.  Negative for age visualized cervical spine. Negative pituitary.  Normal bone marrow signal. Mild bilateral mastoid fluid.  Negative nasopharynx.  Negative paranasal sinuses.  Negative scalp soft tissues.  IMPRESSION: 1.  Small acute white matter lacunar infarct in the left  frontal lobe.  No mass effect or acute hemorrhage. 2.  Mild for age signal changes elsewhere in the brain compatible with chronic small vessel disease.   Original Report Authenticated  By: Erskine Speed, M.D.    Scheduled Meds: . aspirin EC  81 mg Oral Daily  . enoxaparin (LOVENOX) injection  40 mg Subcutaneous Q24H  . levETIRAcetam  500 mg Intravenous STAT  . levETIRAcetam  500 mg Intravenous Q12H  . levothyroxine  50 mcg Oral QAC breakfast  . pneumococcal 23 valent vaccine  0.5 mL Intramuscular Tomorrow-1000  . triamterene-hydrochlorothiazide  1 tablet Oral Daily   Continuous Infusions:   Time spent: 45 minutes  Dorlene Footman L  [facility] Pager [phone]. If 7PM-7AM, please contact night-coverage at [email or web address], password [facility] 09/05/2012, 9:30 AM  LOS: 3 days

## 2012-09-05 NOTE — Progress Notes (Signed)
This afternoon pt appears to be back to his "baseline" from yesterday post seizure this morning. A/Ox1 able to tell me his name is "housey."  He ambulated to the bathroom with 1 assist + walker with no problem. Able to grip my hands and move all extremities on command. Kept NPO waiting for SLP eval tomorrow, sitter still at bedside. Will continue to monitor.

## 2012-09-05 NOTE — Progress Notes (Signed)
PT Cancellation Note  Patient Details Name: Dustin Roy MRN: [id number] DOB: [birth year]   Cancelled Treatment:    Reason Eval/Treat Not Completed: Medical issues which prohibited therapy. Per RN, seizure this am. Will f/u when more medically stable.    [place] HELEN 09/05/2012, 8:58 AM Pager: [phone]

## 2012-09-06 DIAGNOSIS — I519 Heart disease, unspecified: Secondary | ICD-10-CM

## 2012-09-06 DIAGNOSIS — I6350 Cerebral infarction due to unspecified occlusion or stenosis of unspecified cerebral artery: Secondary | ICD-10-CM

## 2012-09-06 LAB — GLUCOSE, CAPILLARY: Glucose-Capillary: 155 mg/dL — ABNORMAL HIGH (ref 70–99)

## 2012-09-06 MED ORDER — SODIUM CHLORIDE 0.9 % IV SOLN
500.0000 mg | Freq: Once | INTRAVENOUS | Status: AC
  Administered 2012-09-06: 500 mg via INTRAVENOUS
  Filled 2012-09-06: qty 5

## 2012-09-06 MED ORDER — KCL IN DEXTROSE-NACL 20-5-0.9 MEQ/L-%-% IV SOLN
INTRAVENOUS | Status: DC
  Administered 2012-09-06: 16:00:00 via INTRAVENOUS
  Administered 2012-09-08: 1000 mL via INTRAVENOUS
  Administered 2012-09-08 – 2012-09-10 (×5): via INTRAVENOUS
  Filled 2012-09-06 (×15): qty 1000

## 2012-09-06 MED ORDER — SODIUM CHLORIDE 0.9 % IV SOLN
750.0000 mg | Freq: Two times a day (BID) | INTRAVENOUS | Status: DC
  Administered 2012-09-06 – 2012-09-07 (×2): 750 mg via INTRAVENOUS
  Filled 2012-09-06 (×3): qty 7.5

## 2012-09-06 MED ORDER — RESOURCE THICKENUP CLEAR PO POWD
ORAL | Status: DC | PRN
  Filled 2012-09-06 (×2): qty 125

## 2012-09-06 NOTE — Progress Notes (Signed)
RN called into pt's room as pt spouse stated she notice pt having a seizure which lasted for just a few seconds . This episode was unwitnessed by the nursing staff. however pt spouse at bedside stated it was some jerking movement of the upper extremeties with  A staring look on pt face with  mouth open & twisted to the left side . No drooling. incontinency nor  twitching noted. Pt was awake and followed simple command & denied any pain  post episode . Vs stable BP 123/46 HR 78 Temp 98.1 oxygen saturation 98 % RA.  MD notified no new orders . RN will continue to monitor and also notify Neuro on call.

## 2012-09-06 NOTE — Clinical Social Work Psychosocial (Signed)
Clinical Social Work Department BRIEF PSYCHOSOCIAL ASSESSMENT 09/06/2012  Patient:  Dustin Roy, Dustin Roy     Account Number:  [id number]     Admit date:  09/02/2012  Clinical Social Worker:  Read Drivers  Date/Time:  09/06/2012 11:45 AM  Referred by:  Physician  Date Referred:  09/06/2012 Referred for  SNF Placement   Other Referral:   none   Interview type:  Other - See comment Other interview type:   pt wife, Dustin Roy    PSYCHOSOCIAL DATA Living Status:  FAMILY Admitted from facility:   Level of care:   Primary support name:  Dustin Roy Primary support relationship to patient:  SPOUSE Degree of support available:   strong    CURRENT CONCERNS Current Concerns  Post-Acute Placement   Other Concerns:    SOCIAL WORK ASSESSMENT / PLAN CSW assessed pt at bedside.  Wife was there along with the pt.  CSW introduced self and CSW role while in the hospital.  Wife is main caregiver for husband.  Pt was resting soundly and unable to participate in assessment. Wife is agreeable to SNF and requests [facility] in [place] [facility] as a first choice.  Her second and third choices are [address] and [facility].  CSW will fax out to all of [place] and relay bed offers once available.   Assessment/plan status:  Psychosocial Support/Ongoing Assessment of Needs Other assessment/ plan:   Information/referral to community resources:   SNF    PATIENT'S/FAMILY'S RESPONSE TO PLAN OF CARE: Pt wife was agreeable to SNF search and was appreciative of CSW assistance and support.       Vickii Penna, LCSWA [phone]  Clinical Social Work

## 2012-09-06 NOTE — Evaluation (Signed)
Clinical/Bedside Swallow Evaluation Patient Details  Name: LUCCAS TOWELL MRN: [id number] Date of Birth: 1920-08-31  Today's Date: 09/06/2012 Time: 7829-5621 SLP Time Calculation (min): 35 min  Past Medical History:  Past Medical History  Diagnosis Date  . Hypertension   . Hypothyroid    Past Surgical History: History reviewed. No pertinent past surgical history. HPI:  79 year old male found down at home. PMH significant for dementia with declining memory x6-12 months, and poorly coherent speech x2 weeks.  MRI indicated small left frontal white matter lacunar infarct.  Pt also demonstrated seizure activity.     Assessment / Plan / Recommendation Clinical Impression  BSE limited due to inability to follow commands, and unwillingness to take any po trials.  Wife reports pt has received meds for seizures, which may be contributing to his current inability to participate fully.  Pt did allow thorough oral care, but was unable to follow directions during the process. Minimal vocalizations elicited, and those were very low in volume, but clear.    Aspiration Risk  Moderate    Diet Recommendation NPO   Medication Administration: Via alternative means    Other  Recommendations     Follow Up Recommendations  24 hour supervision/assistance    Frequency and Duration min 1 x/week  1 week   Pertinent Vitals/Pain None indicated    SLP Swallow Goals Patient will consume recommended diet without observed clinical signs of aspiration with: Total assistance Swallow Study Goal #1 - Progress: Progressing toward goal Patient will utilize recommended strategies during swallow to increase swallowing safety with: Total assistance Swallow Study Goal #2 - Progress: Progressing toward goal   Swallow Study Prior Functional Status  Type of Home: House Available Help at Discharge: Family;Available 24 hours/day    General Date of Onset: 09/02/12 HPI: 79 year old male found down at home. PMH  significant for dementia with declining memory x6-12 months, and poorly coherent speech x2 weeks.  MRI indicated small left frontal white matter lacunar infarct.  Pt also demonstrated seizure activity.   Type of Study: Bedside swallow evaluation Diet Prior to this Study: NPO Temperature Spikes Noted: No Respiratory Status: Room air History of Recent Intubation: No Behavior/Cognition: Lethargic;Doesn't follow directions Oral Cavity - Dentition: Adequate natural dentition Patient Positioning: Upright in chair Baseline Vocal Quality: Low vocal intensity Volitional Cough: Cognitively unable to elicit Volitional Swallow: Unable to elicit    Oral/Motor/Sensory Function Overall Oral Motor/Sensory Function: Other (comment) (unable to assess, as pt does not follow commands)   Ice Chips Other Comments: Pt made no attempt to take ice chip, or lick lips when ice rubbed on them.     Thin Liquid Thin Liquid: Not tested    Nectar Thick Nectar Thick Liquid: Not tested   Honey Thick Honey Thick Liquid: Not tested   Puree Puree: Not tested   Solid   Brigid Vandekamp B. Murvin [facility], CCC-SLP [phone]    Solid: Not tested       Leigh Aurora 09/06/2012,9:49 AM

## 2012-09-06 NOTE — Progress Notes (Addendum)
[facility] PROGRESS NOTE  Dustin Roy ZOX:[id number] DOB: [birth year] DOA: 09/02/2012 PCP: No primary provider on file.  Assessment/Plan:  New seizure: repeat CT brain shows nothing acute.  On keppra. No further witnessed seizures. Still fairly weak, slow. Could be postictal.  Will need SNF.  More alert this afternoon. Have paged speech to reevaluate. Currently NPO  CVA; MRI from 24 June shows a small infarct in the left frontal lobe. The patient cardiac on 81 mg aspirin. Defer to neurology recommendation.   dementia;   Vasovagal episode   DNR  Code Status: DNR Family Communication: none today Disposition Plan: SNF   Consultants: Neurology   Procedures: EEG from 24 June;This is an abnormal EEG secondary to posterior background slowing. This finding may be seen with a diffuse gray matter disturbance that is etiologically nonspecific, but may include a dementia, among other possibilities.  MRI from 24 June: 1. Small acute white matter lacunar infarct in the left frontal lobe. No mass effect or acute hemorrhage.  2. Mild for age signal changes elsewhere in the brain compatible with chronic small vessel disease.  Antibiotics:  None  HPI/Subjective: Thirsty.   Objective: Filed Vitals:   09/05/12 2158 09/06/12 0238 09/06/12 0516 09/06/12 1002  BP: 154/66 136/66 123/48 116/64  Pulse: 56 78 72 80  Temp: 98.2 F (36.8 C) 97.9 F (36.6 C) 97.7 F (36.5 C) 98 F (36.7 C)  TempSrc: Axillary Oral Oral Oral  Resp: 18 18 18 18   Height:      Weight:      SpO2: 100% 97% 98% 100%    Intake/Output Summary (Last 24 hours) at 09/06/12 1433 Last data filed at 09/06/12 1247  Gross per 24 hour  Intake      0 ml  Output      0 ml  Net      0 ml   Filed Weights   09/03/12 0206  Weight: 61.8 kg (136 lb 3.9 oz)    Exam: General: in Chair. Slow to respond. Difficult to understand  Cardiovascular: Regular rhythm and rate, negative murmurs rubs gallops  Respiratory:  Clear to auscultation bilaterally  Abdomen: Soft, nontender, nondistended plus bowel sounds  Neurologic no CN defecits. Globally weak without focal weakness. bradykinesia  Data Reviewed: Basic Metabolic Panel:  Recent Labs Lab 09/02/12 2004 09/04/12 1010  NA 138 135  K 3.4* 3.6  CL 100 97  CO2 28 28  GLUCOSE 115* 110*  BUN 24* 15  CREATININE 1.16 0.92  CALCIUM 9.4 9.3  MG  --  1.8   Liver Function Tests:  Recent Labs Lab 09/02/12 2004 09/04/12 1010  AST 25 21  ALT 16 12  ALKPHOS 120* 120*  BILITOT 0.4 0.5  PROT 6.5 6.0  ALBUMIN 3.5 3.0*   No results found for this basename: LIPASE, AMYLASE,  in the last 168 hours No results found for this basename: AMMONIA,  in the last 168 hours CBC:  Recent Labs Lab 09/02/12 2004 09/04/12 1010  WBC 10.9* 6.1  NEUTROABS 9.4*  --   HGB 12.9* 13.4  HCT 36.8* 37.7*  MCV 98.9 98.4  PLT 181 198   Cardiac Enzymes:  Recent Labs Lab 09/02/12 2004 09/03/12 0052 09/03/12 0650 09/03/12 1228  TROPONINI <0.30 <0.30 <0.30 <0.30   BNP (last 3 results) No results found for this basename: PROBNP,  in the last 8760 hours CBG:  Recent Labs Lab 09/06/12 1103  GLUCAP 155*    Recent Results (from the past 240 hour(s))  URINE CULTURE     Status: None   Collection Time    09/04/12  1:54 PM      Result Value Range Status   Specimen Description URINE, CLEAN CATCH   Final   Special Requests NONE   Final   Culture  Setup Time 09/04/2012 23:47   Final   Colony Count NO GROWTH   Final   Culture NO GROWTH   Final   Report Status 09/05/2012 FINAL   Final    Echo: Left ventricle: The cavity size was normal. Systolic function was normal. The estimated ejection fraction was in the range of 55% to 60%. Doppler parameters are consistent with abnormal left ventricular relaxation (grade 1 diastolic dysfunction). - Ventricular septum: Septal motion showed abnormal function and dyssynergy. - Mitral valve: Calcified annulus. -  Pericardium, extracardiac: A trivial pericardial effusion was identified. Impressions:  - Extremely limited; LV function appears to be preserved; cannot R/O SOE with this study; suggest TEE to better assess if clinically indicated.  Bilateral carotid artery duplex: Less than 39% ICA stenosis. Vertebral artery flow is antegrade.    Ct Head Wo Contrast  09/05/2012   *RADIOLOGY REPORT*  Clinical Data: Decreased level of consciousness, lethargy, recent fall, stroke, seizure  CT HEAD WITHOUT CONTRAST  Technique:  Contiguous axial images were obtained from the base of the skull through the vertex without contrast.  Comparison: 09/02/2012; 01/27/2012; brain MRI - 09/04/2012  Findings:  Redemonstrated advanced atrophy with sulcal prominence and centralized volume loss with commiserate ex vacuo dilatation of the ventricular system.  Scattered periventricular hypodensities are grossly unchanged and favored to be the sequelae of microvascular ischemic disease.  Given the background parenchymal abnormalities, there is no CT evidence of acute large territory infarct.  No intraparenchymal or extra-axial mass or hemorrhage.  Normal size and configuration of the ventricles and basilar cisterns.  No midline shift.  Post bilateral cataract surgery.  The regional soft tissues are normal.  No displaced calvarial fracture.  IMPRESSION: Advanced atrophy and microvascular ischemic disease without acute intracranial process.   Original Report Authenticated By: Tacey Ruiz, MD   Mr Brain Wo Contrast  09/04/2012   *RADIOLOGY REPORT*  Clinical Data: 79 year old male with increased confusion, found down.  Possible CVA.  MRI HEAD WITHOUT CONTRAST  Technique:  Multiplanar, multiecho pulse sequences of the brain and surrounding structures were obtained according to standard protocol without intravenous contrast.  Comparison: Head CTs 09/02/2012 and earlier.  Findings: There is a small ( 5-6 mm) area of subcortical white matter  restricted diffusion in the left hemisphere near the body of the left lateral ventricle.  Mild associated T2 and FLAIR hyperintensity. No associated mass effect or acute hemorrhage.  No other restricted diffusion identified. Major intracranial vascular flow voids are preserved. No midline shift, mass effect, or evidence of mass lesion. Mild, patchy T2 and FLAIR hyperintensity in the bilateral cerebral white matter outside of the area of diffusion abnormality. There are, there are occasional chronic micro hemorrhages in the brain (right cerebellar hemisphere series 7 image 5).  Mild ex vacuo related enlargement of the ventricles.  Mild for age deep gray matter nuclei and brainstem T2 heterogeneity. Cerebellum within normal limits.  Negative for age visualized cervical spine. Negative pituitary.  Normal bone marrow signal. Mild bilateral mastoid fluid.  Negative nasopharynx.  Negative paranasal sinuses.  Negative scalp soft tissues.  IMPRESSION: 1.  Small acute white matter lacunar infarct in the left  frontal lobe.  No mass effect or  acute hemorrhage. 2.  Mild for age signal changes elsewhere in the brain compatible with chronic small vessel disease.   Original Report Authenticated By: Erskine Speed, M.D.    Scheduled Meds: . aspirin EC  81 mg Oral Daily  . enoxaparin (LOVENOX) injection  40 mg Subcutaneous Q24H  . levETIRAcetam  500 mg Intravenous Q12H  . levothyroxine  50 mcg Oral QAC breakfast   Continuous Infusions:   Time spent: 35 minutes  Dustin Roy  [facility] Pager [phone]. If 7PM-7AM, please contact night-coverage at [email or web address], password [facility] 09/06/2012, 2:33 PM  LOS: 4 days

## 2012-09-06 NOTE — Evaluation (Signed)
Physical Therapy Evaluation Patient Details Name: Dustin Roy MRN: [id number] DOB: October 24, 1920 Today's Date: 09/06/2012 Time: 2440-1027 PT Time Calculation (min): 17 min  PT Assessment / Plan / Recommendation History of Present Illness  Pt admitted with seizure, decreased memory and post-concussive syndrome after fall at home.  Clinical Impression  Pt demonstrates decreased awareness, balance, strength and functional mobility and will benefit from acute therapy to address these deficits and decrease burden of care   PT Assessment  Patient needs continued PT services    Follow Up Recommendations  SNF;Supervision/Assistance - 24 hour (if unable to reach supervision level)    Does the patient have the potential to tolerate intense rehabilitation      Barriers to Discharge Decreased caregiver support      Equipment Recommendations  Rolling walker with 5" wheels    Recommendations for Other Services OT consult   Frequency Min 3X/week    Precautions / Restrictions Precautions Precautions: Fall   Pertinent Vitals/Pain Pain at right hip with pressure Wife also endorses ulcer on plantar aspect of right foot      Mobility  Bed Mobility Bed Mobility: Supine to Sit;Sitting - Scoot to Edge of Bed Supine to Sit: 4: Min assist;HOB flat Sitting - Scoot to [place] of Bed: 4: Min assist Details for Bed Mobility Assistance: cueing for sequence with hand held assist to complete bed mobility Transfers Transfers: Sit to Stand;Stand to Sit Sit to Stand: 4: Min assist;From bed Stand to Sit: 4: Min assist;To chair/3-in-1 Details for Transfer Assistance: cueing for hand placement and safety Ambulation/Gait Ambulation/Gait Assistance: 4: Min assist Ambulation Distance (Feet): 100 Feet Assistive device: 1 person hand held assist Ambulation/Gait Assistance Details: assist at pelvis to prevent LOB pt with tendency to lean right, noted pain with assist at hip wife reports bruising from fall at  home. directional assist required Gait Pattern: Step-through pattern;Decreased stride length;Trunk flexed;Narrow base of support Gait velocity: decreased Stairs: No    Exercises     PT Diagnosis: Difficulty walking;Altered mental status  PT Problem List: Decreased strength;Decreased activity tolerance;Decreased balance;Decreased mobility;Decreased cognition;Decreased coordination PT Treatment Interventions: Gait training;DME instruction;Functional mobility training;Therapeutic activities;Therapeutic exercise;Balance training;Cognitive remediation;Patient/family education     PT Goals(Current goals can be found in the care plan section) Acute Rehab PT Goals Patient Stated Goal: pt unable- wife would like pt to return to independent mobility PT Goal Formulation: With patient/family Time For Goal Achievement: 09/20/12 Potential to Achieve Goals: Fair  Visit Information  Last PT Received On: 09/06/12 Assistance Needed: +1 History of Present Illness: Pt admitted with seizure, decreased memory and post-concussive syndrome after fall at home.       Prior Functioning  Home Living Family/patient expects to be discharged to:: Private residence Living Arrangements: Spouse/significant other Available Help at Discharge: Family;Available 24 hours/day Type of Home: House Home Access: Stairs to enter [employer] of Steps: 2 Entrance Stairs-Rails: Can reach both;Right;Left Home Layout: Two level;Able to live on main level with bedroom/bathroom Home Equipment: None Prior Function Level of Independence: Independent Comments: took care of his own bathing and dressing. Communication Communication: HOH    Cognition  Cognition Arousal/Alertness: Lethargic Behavior During Therapy: Flat affect Overall Cognitive Status: Difficult to assess (pt responded only to his and wife's name)    Extremity/Trunk Assessment Upper Extremity Assessment Upper Extremity Assessment: Defer to OT  evaluation Lower Extremity Assessment Lower Extremity Assessment: Generalized weakness Cervical / Trunk Assessment Cervical / Trunk Assessment: Kyphotic   Balance    End of Session PT -  End of Session Equipment Utilized During Treatment: Gait belt Activity Tolerance: Patient limited by lethargy Patient left: in chair;with call bell/phone within reach;with family/visitor present;with nursing/sitter in room Nurse Communication: Mobility status  GP     Delorse Lek 09/06/2012, 9:25 AM Delaney Meigs, PT [phone]

## 2012-09-06 NOTE — Progress Notes (Addendum)
Speech Language Pathology Dysphagia Treatment Patient Details Name: Dustin Roy MRN: [id number] DOB: 01/22/1921 Today's Date: 09/06/2012 Time: 4098-1191 SLP Time Calculation (min): 16 min  Assessment / Plan / Recommendation Clinical Impression  SLP called by MD due to improved arousal. Pt easily awakened, required max verbal/tactile cues to initiate automatic responses to PO, then cues fading to minimal visual cues for intake. Pt did remain mildly lethargic with decreased reponsiveness. Suspect delayed swallow response due to arousal that resulted in overt evidence of aspiration x1 with thin liquids. Nectar thick liquid also trialed, pt appears functional with this texture, reduced risk. Pt able to masticate soft solids as well. Recommend pt initate a Dys 2/nectar thick diet until arousal is WFL. Do not expect impairment or need for modifiied diet to persist.     Diet Recommendation  Initiate / Change Diet: Dysphagia 2 (fine chop);Nectar-thick liquid    SLP Plan Goals updated   Pertinent Vitals/Pain NA   Swallowing Goals  SLP Swallowing Goals Patient will utilize recommended strategies during swallow to increase swallowing safety with: Minimal cueing Swallow Study Goal #2 - Progress: Progressing toward goal  General Temperature Spikes Noted: No Respiratory Status: Room air Behavior/Cognition: Cooperative;Lethargic Oral Cavity - Dentition: Adequate natural dentition Patient Positioning: Upright in chair  Oral Cavity - Oral Hygiene Does patient have any of the following "at risk" factors?: Other - dysphagia;Nutritional status - inadequate Patient is AT RISK - Oral Care Protocol followed (see row info): Yes   Dysphagia Treatment Treatment focused on: Upgraded PO texture trials;Facilitation of oral phase Treatment Methods/Modalities: Skilled observation;Differential diagnosis Patient observed directly with PO's: Yes Type of PO's observed: Dysphagia 3 (soft);Thin  liquids;Nectar-thick liquids Feeding: Needs assist Liquids provided via: Cup;Straw Pharyngeal Phase Signs & Symptoms: Suspected delayed swallow initiation;Multiple swallows Type of cueing: Verbal;Tactile;Visual Amount of cueing: Moderate   GO    Dustin Roy, [place] CCC-SLP [phone]  Claudine Mouton 09/06/2012, 3:27 PM

## 2012-09-06 NOTE — Progress Notes (Addendum)
Stroke Team Progress Note  HISTORY  Dustin Roy is an 79 y.o. male who has been having declining memory over the last year and especially the last 6 months per wife. He has been seen as a out patient by his PCP for his declining memory and offered a trial of Aricept. His wife was not sure about starting this medication due to fear of some of the side effects. He does have known B12 deficiency which he received one injection for but, per wife, this did not have a large effect and his levels did not increase. 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 Over the past two weeks his wife has noted his speech has become less coherent (at times normal and other times making no sense). Over the past two days she cannot understand what he is saying. Patient is in the hospital due to falling in his bathroom and having contusion of his head. Initial CT head showed moderate atrophy and no blood. Neurology was asked to see patient for evaluation of Dementia and initiating Aricept.   SUBJECTIVE  Patient had episode yesterday am where he was lying down and arching of his arms witnessed by his wife. Sleepy afterwards. She describes a fall at home  which could have been time of infarct just before patient came to hospital. No new seizures. Seems to be waking up some.  OBJECTIVE Most recent Vital Signs: Filed Vitals:   09/06/12 0238 09/06/12 0516 09/06/12 1002 09/06/12 1502  BP: 136/66 123/48 116/64 119/66  Pulse: 78 72 80 81  Temp: 97.9 F (36.6 C) 97.7 F (36.5 C) 98 F (36.7 C) 97.7 F (36.5 C)  TempSrc: Oral Oral Oral Oral  Resp: 18 18 18 18   Height:      Weight:      SpO2: 97% 98% 100% 100%   CBG (last 3)   Recent Labs  09/06/12 1103  GLUCAP 155*    IV Fluid Intake:   . dextrose 5 % and 0.9 % NaCl with KCl 20 mEq/L      MEDICATIONS  . aspirin EC  81 mg Oral Daily  . enoxaparin (LOVENOX) injection  40 mg Subcutaneous Q24H  .  levETIRAcetam  500 mg Intravenous Q12H   PRN:    Diet:  NPO   Activity:  ambulate DVT Prophylaxis:  lovenox  CLINICALLY SIGNIFICANT STUDIES Basic Metabolic Panel:   Recent Labs Lab 09/02/12 2004 09/04/12 1010  NA 138 135  K 3.4* 3.6  CL 100 97  CO2 28 28  GLUCOSE 115* 110*  BUN 24* 15  CREATININE 1.16 0.92  CALCIUM 9.4 9.3  MG  --  1.8   Liver Function Tests:   Recent Labs Lab 09/02/12 2004 09/04/12 1010  AST 25 21  ALT 16 12  ALKPHOS 120* 120*  BILITOT 0.4 0.5  PROT 6.5 6.0  ALBUMIN 3.5 3.0*   CBC:   Recent Labs Lab 09/02/12 2004 09/04/12 1010  WBC 10.9* 6.1  NEUTROABS 9.4*  --   HGB 12.9* 13.4  HCT 36.8* 37.7*  MCV 98.9 98.4  PLT 181 198   Coagulation:   Recent Labs Lab 09/02/12 2004  LABPROT 13.2  INR 1.01   Cardiac Enzymes:   Recent Labs Lab 09/03/12 0052 09/03/12 0650 09/03/12 1228  TROPONINI <0.30 <0.30 <0.30   Urinalysis:   Recent Labs Lab 09/02/12 2104  COLORURINE YELLOW  LABSPEC 1.022  PHURINE 5.5  GLUCOSEU NEGATIVE  HGBUR TRACE*  BILIRUBINUR NEGATIVE  KETONESUR NEGATIVE  PROTEINUR NEGATIVE  UROBILINOGEN 0.2  NITRITE NEGATIVE  LEUKOCYTESUR NEGATIVE   Lipid Panel No results found for this basename: chol,  trig,  hdl,  cholhdl,  vldl,  ldlcalc   HgbA1C  No results found for this basename: HGBA1C    Urine Drug Screen:   No results found for this basename: labopia,  cocainscrnur,  labbenz,  amphetmu,  thcu,  labbarb    Alcohol Level: No results found for this basename: ETH,  in the last 168 hours  Mr Brain Wo Contrast 09/04/2012  1.  Small acute white matter lacunar infarct in the left  frontal lobe.  No mass effect or acute hemorrhage. 2.  Mild for age signal changes elsewhere in the brain compatible with chronic small vessel disease.     CT of the brain  Advanced atrophy and microvascular ischemic disease without acute intracranial process.   MRA of the brain    2D Echocardiogram  EF 60%, LV fxn appears  preserved.  Carotid Doppler  Bilateral carotid artery duplex: Less than 39% ICA stenosis. Vertebral artery flow is antegrade.   CXR  Mild left lower lobe atelectasis  EEG This is an abnormal EEG secondary to posterior background slowing. This finding may be seen with a diffuse gray matter disturbance that is etiologicallynonspecific, but may include a dementia, among other possibilities.   EKG  paced.   Therapy Recommendations patient to sleepy for therapy  Physical Exam   Neurologic Examination:  Mental Status:  Somnolent. Patient speaks but makes no recognizable words. Patient had seizure earlier. Still felt to be in the post ictal phase. Cranial Nerves:  II: Discs flat bilaterally; Visual fields blinks to threat and tracks movements in room, pupils equal, round, reactive to light and accommodation  III,IV, VI: ptosis not present, extra-ocular motions intact bilaterally  V,VII: face symmetric, facial light touch sensation normal bilaterally  VIII: hearing normal bilaterally  IX,X: gag reflex present  XI: bilateral shoulder shrug  XII: midline tongue extension  Motor:  Spontaneous movements bilaterally, withdraws to pain Plantars:  Right: equivocal Left: equivocal  Cerebellar:  Not able to assess as patient does not follow commands  Gait: Unable to assess   ASSESSMENT [name] is a 79 y.o. male presenting with fall in bathroom and confusion. Initial scan showed small left lacunar infarct in frontal lobe. Patient has since had an episode of where he was arching his arms and was non responsive in the room, witnessed by wife and staff; felt to be a seizure. History of dementia and currently still with post ictal confusion probably exacerbated by the dementia. Repeat CT today did not show an acute stroke.  On aspirin 81 mg orally every day prior to admission. Now on aspirin 81 mg orally every day for secondary stroke prevention. Work up  underway.   Hypertension  Hyperlipidemia  Long term medication use  Dementia  seizure  Hospital day # 4  TREATMENT/PLAN  Continue aspirin 81 mg orally every day for secondary stroke prevention.  Patient started on keppra 500mg  Q 12 hours with no further episodes  His initial episode was felt to be stroke in nature with his further episodes more notable for seizures.  Lipids/HgbA1C  Therapy evaluations when patient is more alert.  Gwendolyn Lima. Manson Passey, [facility], MBA, MHA [facility] Pager: [phone] 09/06/2012 3:28 PM

## 2012-09-06 NOTE — Progress Notes (Signed)
Reviewed Note, agree with content.  Jeani Hawking, OTR/L [phone]

## 2012-09-06 NOTE — Progress Notes (Signed)
  Echocardiogram 2D Echocardiogram has been performed.  [place] 09/06/2012, 12:02 PM

## 2012-09-06 NOTE — Progress Notes (Signed)
Occupational Therapy Evaluation Patient Details Name: Dustin Roy MRN: [id number] DOB: [birth year] Today's Date: 09/06/2012 Time: 7829-5621 OT Time Calculation (min): 39 min  OT Assessment / Plan / Recommendation History of present illness 79 year old male found down at home in bathroom. PMH significant for dementia with declining memory x6-12 months, and poorly coherent speech x2 weeks.  MRI indicated small left frontal white matter lacunar infarct.  Pt also demonstrated seizure activity.   Clinical Impression   Pt very lethargic during session. Pt was non-verbal during session with exception of "ouch" during sit<>stand (sitter said R hip sore from fall). Despite cognitive decline during the past 6 months, PTA Pt was independent in self-care (bathing and dressing) but wife did cooking/housework etc. Pt inconsistently uses grooming objects correctly when placed in his hands. Pt incontinent with bowel and bladder. Pt would benefit from skilled OT services to address cognitive and balance deficits and facilitate d/c to SNF.     OT Assessment  Patient needs continued OT Services    Follow Up Recommendations  SNF       Equipment Recommendations  Other (comment) (TBD by SNF)       Frequency  Min 2X/week    Precautions / Restrictions Precautions Precautions: Fall Restrictions Weight Bearing Restrictions: No   Pertinent Vitals/Pain Pt said "ouch" when OTS temporarily placed hand on right hip during sit<>stand. Otherwise no pain indicated.    ADL  Grooming: Wash/dry hands;Brushing hair;Minimal assistance Where Assessed - Grooming: Supported standing Upper Body Bathing: Maximal assistance Where Assessed - Upper Body Bathing: Supported standing Lower Body Bathing: +1 Total assistance Where Assessed - Lower Body Bathing: Supported sit to stand Upper Body Dressing: Maximal assistance Where Assessed - Upper Body Dressing: Supported standing Lower Body Dressing: +1 Total  assistance Where Assessed - Lower Body Dressing: Supported sit to Pharmacist, hospital: Moderate assistance Toilet Transfer Method: Sit to stand Toilet Transfer Equipment: Comfort height toilet Toileting - Clothing Manipulation and Hygiene: +1 Total assistance Where Assessed - Toileting Clothing Manipulation and Hygiene: Standing Equipment Used: Gait belt Transfers/Ambulation Related to ADLs: Pt was min assist with max tactile and vc's for transitions. Pt was min assist hand hold assist for ambulation from bed, to bathroom, to chair. with tactile guiding for direction.  ADL Comments: Standing at sink, Pt able to recognize comb and brush hair with set up and min guard. Pt able to wash hands with mod assist and max verbal/auditory/tactile cues. When Pt was on the toilet, he did not react/respond when given toilet paper for contextual reference, and did not use the bathroom. Pt unable to perform peri care. Pt requires incr time for processing tasks, and completing tasks usually with max assist    OT Diagnosis: Generalized weakness;Cognitive deficits  OT Problem List: Decreased strength;Decreased activity tolerance;Impaired balance (sitting and/or standing);Decreased cognition;Decreased safety awareness OT Treatment Interventions: Self-care/ADL training;DME and/or AE instruction;Therapeutic activities;Cognitive remediation/compensation;Visual/perceptual remediation/compensation;Patient/family education;Balance training   OT Goals(Current goals can be found in the care plan section) Acute Rehab OT Goals OT Goal Formulation: Patient unable to participate in goal setting Potential to Achieve Goals: Fair  Visit Information  Last OT Received On: 09/06/12 Assistance Needed: +1 History of Present Illness: 79 year old male found down at home in bathroom. PMH significant for dementia with declining memory x6-12 months, and poorly coherent speech x2 weeks.  MRI indicated small left frontal white matter  lacunar infarct.  Pt also demonstrated seizure activity.       Prior Functioning  Home Living Family/patient expects to be discharged to:: Private residence Living Arrangements: Spouse/significant other Available Help at Discharge: Family;Available 24 hours/day Type of Home: House Home Access: Stairs to enter [employer] of Steps: 2 Entrance Stairs-Rails: Can reach both;Right;Left Home Layout: Two level;Able to live on main level with bedroom/bathroom Alternate Level Stairs-Number of Steps: 14 Alternate Level Stairs-Rails: Right Home Equipment: None Additional Comments: cane was mother in law's  Prior Function Level of Independence: Independent Comments: took care of his own bathing and dressing. Communication Communication: HOH;Expressive difficulties            Cognition  Cognition Arousal/Alertness: Lethargic Behavior During Therapy: Flat affect Overall Cognitive Status: Difficult to assess (Pt non verbal during session) Difficult to assess due to: Impaired communication;Hard of hearing/deaf    Extremity/Trunk Assessment Cervical / Trunk Assessment Cervical / Trunk Assessment: Kyphotic     Mobility Bed Mobility Bed Mobility: Supine to Sit;Sitting - Scoot to Edge of Bed Supine to Sit: 1: +1 Total assist;HOB flat Sitting - Scoot to Edge of Bed: 1: +1 Total assist Details for Bed Mobility Assistance: Pt was total assist for bed mobility, max vc's to let Pt attempt to help, but none made. Pt able to sit EOB with min assist. Transfers Transfers: Sit to Stand;Stand to Sit Sit to Stand: 4: Min assist;From bed;From toilet (max tactile and vc's) Stand to Sit: 4: Min assist;To chair/3-in-1;To toilet;Other (comment) (max tactile and vc's) Details for Transfer Assistance: max tactile and verbal cueing paired with min assistance for transfers.            End of Session OT - End of Session Equipment Utilized During Treatment: Gait belt Activity Tolerance:  Patient limited by fatigue Patient left: in chair;with call bell/phone within reach;with nursing/sitter in room Nurse Communication: Mobility status  GO     Sherryl Manges 09/06/2012, 3:12 PM

## 2012-09-06 NOTE — Progress Notes (Signed)
Bilateral carotid artery duplex:  Less than 39% ICA stenosis.  Vertebral artery flow is antegrade.     

## 2012-09-07 DIAGNOSIS — S41109A Unspecified open wound of unspecified upper arm, initial encounter: Secondary | ICD-10-CM

## 2012-09-07 DIAGNOSIS — S41119A Laceration without foreign body of unspecified upper arm, initial encounter: Secondary | ICD-10-CM

## 2012-09-07 LAB — BASIC METABOLIC PANEL
Calcium: 8.7 mg/dL (ref 8.4–10.5)
Creatinine, Ser: 0.94 mg/dL (ref 0.50–1.35)
GFR calc Af Amer: 83 mL/min — ABNORMAL LOW (ref >90)

## 2012-09-07 LAB — LIPID PANEL
HDL: 50 mg/dL (ref >39)
LDL Cholesterol: 70 mg/dL (ref 0–99)
Total CHOL/HDL Ratio: 2.8 RATIO
Triglycerides: 101 mg/dL (ref <150)
VLDL: 20 mg/dL (ref 0–40)

## 2012-09-07 LAB — CBC: WBC: 7.2 10*3/uL (ref 4.0–10.5)

## 2012-09-07 LAB — HEMOGLOBIN A1C: Hgb A1c MFr Bld: 5.8 % — ABNORMAL HIGH (ref <5.7)

## 2012-09-07 MED ORDER — SODIUM CHLORIDE 0.9 % IV SOLN
500.0000 mg | INTRAVENOUS | Status: AC
  Administered 2012-09-07: 500 mg via INTRAVENOUS
  Filled 2012-09-07: qty 5

## 2012-09-07 MED ORDER — SODIUM CHLORIDE 0.9 % IV SOLN
1000.0000 mg | Freq: Two times a day (BID) | INTRAVENOUS | Status: DC
  Administered 2012-09-07 – 2012-09-10 (×6): 1000 mg via INTRAVENOUS
  Filled 2012-09-07 (×10): qty 10

## 2012-09-07 NOTE — Progress Notes (Signed)
Visited with pts wife who reports pt has been sleeping soundly due to new seizures today. Meals have been held. Pt still very lethargic. Will try to see again at the end of the day. Harlon Ditty, MA CCC-SLP [phone]

## 2012-09-07 NOTE — Clinical Social Work Note (Signed)
Clinical Social Worker continuing to follow patient and family for support and discharge planning needs.  CSW spoke with patient family at bedside who states their facility preference is for [facility].  CSW spoke with facility who is willing to extend the bed offer.  Per MD, patient remains medically unstable due to continued seizures and may be ready for discharge on Monday, June 30.  Facility agreeable with this plan and will have bed available - patient family aware and appreciative.  CSW will continue to follow for support and to facilitate patient discharge needs once medically ready.  Dustin Roy, [place] [phone]

## 2012-09-07 NOTE — Clinical Social Work Placement (Signed)
Clinical Social Work Department CLINICAL SOCIAL WORK PLACEMENT NOTE 09/07/2012  Patient:  Dustin Roy, Dustin Roy  Account Number:  [id number] Admit date:  09/02/2012  Clinical Social Worker:  Macario Golds, LCSW  Date/time:  09/06/2012 04:30 PM  Clinical Social Work is seeking post-discharge placement for this patient at the following level of care:   SKILLED NURSING   (*CSW will update this form in Epic as items are completed)   09/06/2012  Patient/family provided with [facility] Department of Clinical Social Work's list of facilities offering this level of care within the geographic area requested by the patient (or if unable, by the patient's family).  09/06/2012  Patient/family informed of their freedom to choose among providers that offer the needed level of care, that participate in Medicare, Medicaid or managed care program needed by the patient, have an available bed and are willing to accept the patient.  09/06/2012  Patient/family informed of [facility]' ownership interest in [facility], as well as of the fact that they are under no obligation to receive care at this facility.  PASARR submitted to EDS on 09/06/2012 PASARR number received from EDS on 09/06/2012  FL2 transmitted to all facilities in geographic area requested by pt/family on  09/06/2012 FL2 transmitted to all facilities within larger geographic area on   Patient informed that his/her managed care company has contracts with or will negotiate with  certain facilities, including the following:     Patient/family informed of bed offers received:  09/07/2012 Patient chooses bed at [facility] [place] Physician recommends and patient chooses bed at    Patient to be transferred to  on   Patient to be transferred to facility by   The following physician request were entered in Epic:   Additional Comments:

## 2012-09-07 NOTE — Progress Notes (Deleted)
Stroke Team Progress Note  HISTORY  Dustin Roy is an 79 y.o. male who has been having declining memory over the last year and especially the last 6 months per wife. He has been seen as a out patient by his PCP for his declining memory and offered a trial of Aricept. His wife was not sure about starting this medication due to fear of some of the side effects. He does have known B12 deficiency which he received one injection for but, per wife, this did not have a large effect and his levels did not increase. 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 Over the past two weeks his wife has noted his speech has become less coherent (at times normal and other times making no sense). Over the past two days she cannot understand what he is saying. Patient is in the hospital due to falling in his bathroom and having contusion of his head. Initial CT head showed moderate atrophy and no blood. Neurology was asked to see patient for evaluation of Dementia and initiating Aricept.   SUBJECTIVE  Patient had episode yesterday am where he was lying down and arching of his arms witnessed by his wife. Sleepy afterwards. She describes a fall at home  which could have been time of infarct just before patient came to hospital. No new seizures. Seems to be waking up some.  OBJECTIVE Most recent Vital Signs: Filed Vitals:   09/06/12 1753 09/06/12 2111 09/07/12 0133 09/07/12 0515  BP: 143/59 128/54 120/51 122/47  Pulse: 74 83 81 77  Temp: 98.3 F (36.8 C) 98.4 F (36.9 C) 98.2 F (36.8 C) 98.1 F (36.7 C)  TempSrc: Oral Oral Oral Axillary  Resp: 18 16 17 18   Height:      Weight:      SpO2: 99% 100% 100% 99%   CBG (last 3)   Recent Labs  09/06/12 1103  GLUCAP 155*    IV Fluid Intake:   . dextrose 5 % and 0.9 % NaCl with KCl 20 mEq/L 100 mL/hr at 09/06/12 1540    MEDICATIONS  . aspirin EC  81 mg Oral Daily  . enoxaparin (LOVENOX) injection  40 mg  Subcutaneous Q24H  . levETIRAcetam  750 mg Intravenous Q12H   PRN:    Diet:  Dysphagia  2 Activity:  ambulate DVT Prophylaxis:  lovenox  CLINICALLY SIGNIFICANT STUDIES Basic Metabolic Panel:   Recent Labs Lab 09/02/12 2004 09/04/12 1010 09/07/12 0545  NA 138 135 138  K 3.4* 3.6 3.5  CL 100 97 104  CO2 28 28 28   GLUCOSE 115* 110* 129*  BUN 24* 15 19  CREATININE 1.16 0.92 0.94  CALCIUM 9.4 9.3 8.7  MG  --  1.8  --    Liver Function Tests:   Recent Labs Lab 09/02/12 2004 09/04/12 1010  AST 25 21  ALT 16 12  ALKPHOS 120* 120*  BILITOT 0.4 0.5  PROT 6.5 6.0  ALBUMIN 3.5 3.0*   CBC:   Recent Labs Lab 09/02/12 2004 09/04/12 1010 09/07/12 0545  WBC 10.9* 6.1 7.2  NEUTROABS 9.4*  --   --   HGB 12.9* 13.4 12.3*  HCT 36.8* 37.7* 35.5*  MCV 98.9 98.4 99.2  PLT 181 198 199   Coagulation:   Recent Labs Lab 09/02/12 2004  LABPROT 13.2  INR 1.01   Cardiac Enzymes:   Recent Labs Lab 09/03/12 0052 09/03/12 0650 09/03/12 1228  TROPONINI <0.30 <0.30 <0.30   Urinalysis:   Recent Labs Lab 09/02/12 2104  COLORURINE YELLOW  LABSPEC 1.022  PHURINE 5.5  GLUCOSEU NEGATIVE  HGBUR TRACE*  BILIRUBINUR NEGATIVE  KETONESUR NEGATIVE  PROTEINUR NEGATIVE  UROBILINOGEN 0.2  NITRITE NEGATIVE  LEUKOCYTESUR NEGATIVE   Lipid Panel    Component Value Date/Time   CHOL 140 09/07/2012 0545   LDL 70  HgbA1C   Urine Drug Screen:   No results found for this basename: labopia,  cocainscrnur,  labbenz,  amphetmu,  thcu,  labbarb    Alcohol Level: No results found for this basename: ETH,  in the last 168 hours  Mr Brain Wo Contrast 09/04/2012  1.  Small acute white matter lacunar infarct in the left  frontal lobe.  No mass effect or acute hemorrhage. 2.  Mild for age signal changes elsewhere in the brain compatible with chronic small vessel disease.     CT of the brain  Advanced atrophy and microvascular ischemic disease without acute intracranial process.   MRA  of the brain    2D Echocardiogram  EF 60%, LV fxn appears preserved.  Carotid Doppler  Bilateral carotid artery duplex: Less than 39% ICA stenosis. Vertebral artery flow is antegrade.   CXR  Mild left lower lobe atelectasis  EEG This is an abnormal EEG secondary to posterior background slowing. This finding may be seen with a diffuse gray matter disturbance that is etiologicallynonspecific, but may include a dementia, among other possibilities.   EKG  paced.   Therapy Recommendations CIR  Physical Exam   Neurologic Examination:  Mental Status:  Somnolent. Patient speaks but makes no recognizable words. Patient had seizure earlier. Still felt to be in the post ictal phase. Cranial Nerves:  II: Discs flat bilaterally; Visual fields blinks to threat and tracks movements in room, pupils equal, round, reactive to light and accommodation  III,IV, VI: ptosis not present, extra-ocular motions intact bilaterally  V,VII: face symmetric, facial light touch sensation normal bilaterally  VIII: hearing normal bilaterally  IX,X: gag reflex present  XI: bilateral shoulder shrug  XII: midline tongue extension  Motor:  Spontaneous movements bilaterally, withdraws to pain Plantars:  Right: equivocal Left: equivocal  Cerebellar:  Not able to assess as patient does not follow commands  Gait: Unable to assess   ASSESSMENT [name] is a 79 y.o. male presenting with fall in bathroom and confusion. Initial scan showed small left lacunar infarct in frontal lobe. Patient has since had an episode of where he was arching his arms and was non responsive in the room, witnessed by wife and staff; felt to be a seizure. History of dementia and currently still with post ictal confusion probably exacerbated by the dementia. Repeat CT today did not show an acute stroke.  On aspirin 81 mg orally every day prior to admission. Now on aspirin 81 mg orally every day for secondary stroke prevention. Work up  underway.   Hypertension  Hyperlipidemia, LDL 70 at goal.  Long term medication use  Dementia  Seizure  DYS 2 diet  Hospital day # 5  TREATMENT/PLAN  Continue aspirin 81 mg orally every day for secondary stroke prevention.  Patient started on keppra 500mg  Q 12 hours with no further episodes  His initial episode was felt to be stroke in nature with his further episodes more notable for seizures.  HgbA1C pending  CIR  Gwendolyn Lima. Manson Passey, [facility], MBA, MHA [facility] Pager: [phone] 09/07/2012 8:20 AM

## 2012-09-07 NOTE — Progress Notes (Signed)
HISTORY  Dustin Roy is an 79 y.o. male who has been having declining memory over the last year and especially the last 6 months per wife. He has been seen as a out patient by his PCP for his declining memory and offered a trial of Aricept. His wife was not sure about starting this medication due to fear of some of the side effects. He does have known B12 deficiency which he received one injection for but, per wife, this did not have a large effect and his levels did not increase. 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 Over the past two weeks his wife has noted his speech has become less coherent (at times normal and other times making no sense). Over the past two days she cannot understand what he is saying. Patient is in the hospital due to falling in his bathroom and having contusion of his head. Initial CT head showed moderate atrophy and no blood. Neurology was asked to see patient for evaluation of Dementia and initiating Aricept.   SUBJECTIVE  Patient had another seizure this AM (mouth open, stiff, moan/scream; post-ictal confusion/sleepiness). Now stable and getting increased LEV.  OBJECTIVE  Most recent Vital Signs:  Filed Vitals:    09/06/12 1753  09/06/12 2111  09/07/12 0133  09/07/12 0515   BP:  143/59  128/54  120/51  122/47   Pulse:  74  83  81  77   Temp:  98.3 F (36.8 C)  98.4 F (36.9 C)  98.2 F (36.8 C)  98.1 F (36.7 C)   TempSrc:  Oral  Oral  Oral  Axillary   Resp:  18  16  17  18    Height:       Weight:       SpO2:  99%  100%  100%  99%    CBG (last 3)   Recent Labs   09/06/12 1103   GLUCAP  155*    IV Fluid Intake:  .  dextrose 5 % and 0.9 % NaCl with KCl 20 mEq/L  100 mL/hr at 09/06/12 1540    MEDICATIONS  .  aspirin EC  81 mg  Oral  Daily   .  enoxaparin (LOVENOX) injection  40 mg  Subcutaneous  Q24H   .  levETIRAcetam  750 mg  Intravenous  Q12H   PRN:   Diet: Dysphagia 2  Activity: ambulate   DVT Prophylaxis: lovenox  CLINICALLY SIGNIFICANT STUDIES  Basic Metabolic Panel:   Recent Labs  Lab  09/02/12 2004  09/04/12 1010  09/07/12 0545   NA  138  135  138   K  3.4*  3.6  3.5   CL  100  97  104   CO2  28  28  28    GLUCOSE  115*  110*  129*   BUN  24*  15  19   CREATININE  1.16  0.92  0.94   CALCIUM  9.4  9.3  8.7   MG  --  1.8  --    Liver Function Tests:   Recent Labs  Lab  09/02/12 2004  09/04/12 1010   AST  25  21   ALT  16  12   ALKPHOS  120*  120*   BILITOT  0.4  0.5   PROT  6.5  6.0   ALBUMIN  3.5  3.0*    CBC:   Recent  Labs  Lab  09/02/12 2004  09/04/12 1010  09/07/12 0545   WBC  10.9*  6.1  7.2   NEUTROABS  9.4*  --  --   HGB  12.9*  13.4  12.3*   HCT  36.8*  37.7*  35.5*   MCV  98.9  98.4  99.2   PLT  181  198  199    Coagulation:   Recent Labs  Lab  09/02/12 2004   LABPROT  13.2   INR  1.01    Cardiac Enzymes:   Recent Labs  Lab  09/03/12 0052  09/03/12 0650  09/03/12 1228   TROPONINI  <0.30  <0.30  <0.30    Urinalysis:   Recent Labs  Lab  09/02/12 2104   COLORURINE  YELLOW   LABSPEC  1.022   PHURINE  5.5   GLUCOSEU  NEGATIVE   HGBUR  TRACE*   BILIRUBINUR  NEGATIVE   KETONESUR  NEGATIVE   PROTEINUR  NEGATIVE   UROBILINOGEN  0.2   NITRITE  NEGATIVE   LEUKOCYTESUR  NEGATIVE    Lipid Panel    Component  Value  Date/Time    CHOL  140  09/07/2012 0545    LDL 70  HgbA1C  Urine Drug Screen:  No results found for this basename: labopia, cocainscrnur, labbenz, amphetmu, thcu, labbarb    Alcohol Level: No results found for this basename: ETH, in the last 168 hours   Mr Brain Wo Contrast  09/04/2012 1. Small acute white matter lacunar infarct in the left frontal lobe. No mass effect or acute hemorrhage. 2. Mild for age signal changes elsewhere in the brain compatible with chronic small vessel disease.   CT of the brain Advanced atrophy and microvascular ischemic disease without acute intracranial process.   MRA  of the brain   2D Echocardiogram EF 60%, LV fxn appears preserved.   Carotid Doppler Bilateral carotid artery duplex: Less than 39% ICA stenosis. Vertebral artery flow is antegrade.   CXR Mild left lower lobe atelectasis   EEG This is an abnormal EEG secondary to posterior background slowing. This finding may be seen with a diffuse gray matter disturbance that is etiologically nonspecific, but may include a dementia, among other possibilities.   EKG paced.   Therapy Recommendations CIR   Physical Exam  Neurologic Examination:  Mental Status:  RESTLESS. EYES CLOSED, BUT OPENS TO VOICE. MOANS, BUT CANNOT MAKE RECOGNIZABLE WORDS. NOT FOLLOWING COMMANDS.  Cranial Nerves:  II: pupils equal, round, reactive to light and accommodation  III,IV, VI: ptosis not present, extra-ocular motions intact bilaterally  V,VII: face symmetric, facial light touch sensation normal bilaterally  VIII: hearing normal bilaterally  IX,X: gag reflex present  XI: bilateral shoulder shrug  XII: midline tongue extension  Motor:  Spontaneous movements bilaterally, withdraws to pain    ASSESSMENT  [name] is a 79 y.o. male with dementia, presenting with fall in bathroom and confusion. Initial scan showed small left lacunar infarct in frontal lobe. Patient has since had an episode of where he was arching his arms and was non responsive in the room, witnessed by wife and staff; felt to be a seizure. On aspirin 81 mg orally every day prior to admission. Now on aspirin 81 mg orally every day for secondary stroke prevention. Work up underway.   Hypertension  Hyperlipidemia, LDL 70 at goal.  Long term medication use  Dementia  Additional Seizure  DYS 2 diet  Hospital day # 5  TREATMENT/PLAN  Continue aspirin 81 mg orally every day for secondary stroke prevention.  levetiracetam increased to 1000mg  Q 12 hours. His initial episode was felt to be stroke in nature with his further episodes more  notable for seizures.  HgbA1C remains CIR has been recommended, unclear if this will be appropriate.  Gwendolyn Lima. Manson Passey, PAC, MBA, MHA  [facility]  Pager: [phone]  09/07/2012 8:20 AM  I evaluated and examined patient, reviewed records, labs and imaging, and agree with note and plan. Baseline dementia, small left brain stroke and fall at home, now with seizures. Continue LEV 1000mg  BID.  Suanne Marker, MD 09/07/2012, 6:17 PM Certified in Neurology, Neurophysiology and Neuroimaging [facility] - Stroke Team  Please refer to [email or web address] for on-call Stroke MD

## 2012-09-07 NOTE — Progress Notes (Signed)
[facility] PROGRESS NOTE  Dustin Roy ZOX:[id number] DOB: 1921/02/04 DOA: 09/02/2012 PCP: No primary provider on file.  Assessment/Plan:  Seizures continue. Had one this morning about 30 seconds witnessed by the nursing staff. Wife reports having witnessed on lasting a few seconds last night. Keppra was increased to 750 mg IV twice daily yesterday, then 1000 mg twice daily today. Unable to go to skilled nursing facility until seizures controlled.   CVA; per neuro  dementia;   Right arm skin tear:  wound care nurse has consulted and made recommendations  Dysphagia waxing and waning related to level of alertness and postictal periods  Code Status: DNR Family Communication: none today Disposition Plan: SNF   Consultants: Neurology   Procedures:   Antibiotics:  None  HPI/Subjective: Thirsty.   Objective: Filed Vitals:   09/06/12 2111 09/07/12 0133 09/07/12 0515 09/07/12 1000  BP: 128/54 120/51 122/47 132/59  Pulse: 83 81 77 76  Temp: 98.4 F (36.9 C) 98.2 F (36.8 C) 98.1 F (36.7 C) 98 F (36.7 C)  TempSrc: Oral Oral Axillary Oral  Resp: 16 17 18 18   Height:      Weight:      SpO2: 100% 100% 99% 98%    Intake/Output Summary (Last 24 hours) at 09/07/12 1105 Last data filed at 09/07/12 0500  Gross per 24 hour  Intake    480 ml  Output    100 ml  Net    380 ml   Filed Weights   09/03/12 0206  Weight: 61.8 kg (136 lb 3.9 oz)    Exam: General: eyes closed. Withdrawals to painful stimuli. Does not follow commands or speak Cardiovascular: Regular rhythm and rate, negative murmurs rubs gallops  Respiratory: Clear to auscultation bilaterally  Abdomen: Soft, nontender, nondistended plus bowel sounds  Extremities: Right arm with large skin tear  Data Reviewed: Basic Metabolic Panel:  Recent Labs Lab 09/02/12 2004 09/04/12 1010 09/07/12 0545  NA 138 135 138  K 3.4* 3.6 3.5  CL 100 97 104  CO2 28 28 28   GLUCOSE 115* 110* 129*  BUN 24* 15  19  CREATININE 1.16 0.92 0.94  CALCIUM 9.4 9.3 8.7  MG  --  1.8  --    Liver Function Tests:  Recent Labs Lab 09/02/12 2004 09/04/12 1010  AST 25 21  ALT 16 12  ALKPHOS 120* 120*  BILITOT 0.4 0.5  PROT 6.5 6.0  ALBUMIN 3.5 3.0*   No results found for this basename: LIPASE, AMYLASE,  in the last 168 hours No results found for this basename: AMMONIA,  in the last 168 hours CBC:  Recent Labs Lab 09/02/12 2004 09/04/12 1010 09/07/12 0545  WBC 10.9* 6.1 7.2  NEUTROABS 9.4*  --   --   HGB 12.9* 13.4 12.3*  HCT 36.8* 37.7* 35.5*  MCV 98.9 98.4 99.2  PLT 181 198 199   Cardiac Enzymes:  Recent Labs Lab 09/02/12 2004 09/03/12 0052 09/03/12 0650 09/03/12 1228  TROPONINI <0.30 <0.30 <0.30 <0.30   BNP (last 3 results) No results found for this basename: PROBNP,  in the last 8760 hours CBG:  Recent Labs Lab 09/06/12 1103  GLUCAP 155*    Recent Results (from the past 240 hour(s))  URINE CULTURE     Status: None   Collection Time    09/04/12  1:54 PM      Result Value Range Status   Specimen Description URINE, CLEAN CATCH   Final   Special Requests NONE  Final   Culture  Setup Time 09/04/2012 23:47   Final   Colony Count NO GROWTH   Final   Culture NO GROWTH   Final   Report Status 09/05/2012 FINAL   Final    EEG from 24 June;This is an abnormal EEG secondary to posterior background slowing. This finding may be seen with a diffuse gray matter disturbance that is etiologically nonspecific, but may include a dementia, among other possibilities.  Echo: Left ventricle: The cavity size was normal. Systolic function was normal. The estimated ejection fraction was in the range of 55% to 60%. Doppler parameters are consistent with abnormal left ventricular relaxation (grade 1 diastolic dysfunction). - Ventricular septum: Septal motion showed abnormal function and dyssynergy. - Mitral valve: Calcified annulus. - Pericardium, extracardiac: A trivial pericardial  effusion was identified. Impressions:  - Extremely limited; LV function appears to be preserved; cannot R/O SOE with this study; suggest TEE to better assess if clinically indicated.  Bilateral carotid artery duplex: Less than 39% ICA stenosis. Vertebral artery flow is antegrade.    Ct Head Wo Contrast  09/05/2012   *RADIOLOGY REPORT*  Clinical Data: Decreased level of consciousness, lethargy, recent fall, stroke, seizure  CT HEAD WITHOUT CONTRAST  Technique:  Contiguous axial images were obtained from the base of the skull through the vertex without contrast.  Comparison: 09/02/2012; 01/27/2012; brain MRI - 09/04/2012  Findings:  Redemonstrated advanced atrophy with sulcal prominence and centralized volume loss with commiserate ex vacuo dilatation of the ventricular system.  Scattered periventricular hypodensities are grossly unchanged and favored to be the sequelae of microvascular ischemic disease.  Given the background parenchymal abnormalities, there is no CT evidence of acute large territory infarct.  No intraparenchymal or extra-axial mass or hemorrhage.  Normal size and configuration of the ventricles and basilar cisterns.  No midline shift.  Post bilateral cataract surgery.  The regional soft tissues are normal.  No displaced calvarial fracture.  IMPRESSION: Advanced atrophy and microvascular ischemic disease without acute intracranial process.   Original Report Authenticated By: Tacey Ruiz, MD    Scheduled Meds: . aspirin EC  81 mg Oral Daily  . enoxaparin (LOVENOX) injection  40 mg Subcutaneous Q24H  . levETIRAcetam  1,000 mg Intravenous Q12H  . levETIRAcetam  500 mg Intravenous STAT   Continuous Infusions: . dextrose 5 % and 0.9 % NaCl with KCl 20 mEq/L 100 mL/hr at 09/06/12 1540    Time spent: 35 minutes  Kaiser Belluomini L  [facility] Pager [phone]. If 7PM-7AM, please contact night-coverage at [email or web address], password [facility] 09/07/2012, 11:05 AM  LOS: 5 days

## 2012-09-07 NOTE — Progress Notes (Signed)
Speech Language Pathology Dysphagia Treatment Patient Details Name: Dustin Roy MRN: [id number] DOB: [birth year] Today's Date: 09/07/2012 Time: 4098-1191 SLP Time Calculation (min): 15 min  Assessment / Plan / Recommendation Clinical Impression  PT with increased lethargy ?secondary to increase in medication following seizure this am. Clinician attempted to awaken pt, however pt did not respond to tactile and verbal cues. Oral care provided which pt responded by opening his eyes. Ice chip administered via spoon which pt consumed with multiple swallows. Suspect delayed swallow response seondary to arousal resulting with evidence of aspiration 1x of wet vocal quality. Rec NPO until arousal is WFL.    Diet Recommendation  Initiate / Change Diet: NPO    SLP Plan Continue with current plan of care   Pertinent Vitals/Pain NA   Swallowing Goals  SLP Swallowing Goals Patient will consume recommended diet without observed clinical signs of aspiration with: Total assistance Swallow Study Goal #1 - Progress: Progressing toward goal Patient will utilize recommended strategies during swallow to increase swallowing safety with: Minimal cueing Swallow Study Goal #2 - Progress: Progressing toward goal  General Temperature Spikes Noted: No Respiratory Status: Room air Behavior/Cognition: Lethargic Oral Cavity - Dentition: Adequate natural dentition Patient Positioning: Upright in bed  Oral Cavity - Oral Hygiene Does patient have any of the following "at risk" factors?: Other - dysphagia;Nutritional status - inadequate Brush patient's teeth BID with toothbrush (using toothpaste with fluoride): Yes Patient is AT RISK - Oral Care Protocol followed (see row info): Yes   Dysphagia Treatment Treatment focused on: Patient/family/caregiver education;Skilled observation of diet tolerance Family/Caregiver Educated: Nephew and niece Treatment Methods/Modalities: Skilled observation Patient observed  directly with PO's: Yes Type of PO's observed: Ice chips Feeding: Total assist Pharyngeal Phase Signs & Symptoms: Suspected delayed swallow initiation;Multiple swallows;Wet vocal quality Type of cueing: Verbal;Tactile;Visual Amount of cueing: Maximal   GO     Lyanne Co 09/07/2012, 4:50 PM  Chyrel Masson, MA CCC-SLP

## 2012-09-07 NOTE — Consult Note (Signed)
WOC consult Note Reason for Consult: Consult requested for right arm skin tear which occurred prior to admission. Wound type: Full thickness; no skin flap available to re-approximate over wound. Measurement:2.5X6X.2cm Wound bed: 100% red Drainage (amount, consistency, odor) small yellow drainage Periwound: bruising surrounding to entire right arm Dressing procedure/placement/frequency: Foam dressing to protect and promote healing.  If pt picks at dressing and tries to remove, then cover foam dressing with a "restraint sleeve" or kling wrap to keep in place. (wife states she is worried about this occurring). Please re-consult if further assistance is needed.  Thank-you,  Cammie Mcgee MSN, RN, CWOCN, [place], CNS [phone]

## 2012-09-08 ENCOUNTER — Inpatient Hospital Stay (HOSPITAL_COMMUNITY): Payer: Medicare Other

## 2012-09-08 DIAGNOSIS — R509 Fever, unspecified: Secondary | ICD-10-CM

## 2012-09-08 LAB — URINALYSIS, ROUTINE W REFLEX MICROSCOPIC
Glucose, UA: NEGATIVE mg/dL
Hgb urine dipstick: NEGATIVE
Leukocytes, UA: NEGATIVE
Specific Gravity, Urine: 1.021 (ref 1.005–1.030)
Urobilinogen, UA: 1 mg/dL (ref 0.0–1.0)

## 2012-09-08 LAB — CBC WITH DIFFERENTIAL/PLATELET
Eosinophils Relative: 1 % (ref 0–5)
HCT: 36.9 % — ABNORMAL LOW (ref 39.0–52.0)
Lymphocytes Relative: 12 % (ref 12–46)
Lymphs Abs: 1.1 10*3/uL (ref 0.7–4.0)
MCV: 99.5 fL (ref 78.0–100.0)
Neutro Abs: 6.9 10*3/uL (ref 1.7–7.7)
Platelets: 211 10*3/uL (ref 150–400)
RBC: 3.71 MIL/uL — ABNORMAL LOW (ref 4.22–5.81)
WBC: 9.1 10*3/uL (ref 4.0–10.5)

## 2012-09-08 LAB — BASIC METABOLIC PANEL
CO2: 27 mEq/L (ref 19–32)
Calcium: 9.1 mg/dL (ref 8.4–10.5)
Chloride: 105 mEq/L (ref 96–112)
Glucose, Bld: 136 mg/dL — ABNORMAL HIGH (ref 70–99)
Potassium: 3.7 mEq/L (ref 3.5–5.1)
Sodium: 139 mEq/L (ref 135–145)

## 2012-09-08 LAB — AMMONIA: Ammonia: 17 umol/L (ref 11–60)

## 2012-09-08 MED ORDER — LEVOTHYROXINE SODIUM 100 MCG IV SOLR
25.0000 ug | Freq: Every day | INTRAVENOUS | Status: DC
  Administered 2012-09-08 – 2012-09-11 (×4): 25 ug via INTRAVENOUS
  Filled 2012-09-08 (×4): qty 5

## 2012-09-08 MED ORDER — ASPIRIN 300 MG RE SUPP
300.0000 mg | Freq: Every day | RECTAL | Status: DC
  Administered 2012-09-08 – 2012-09-10 (×3): 300 mg via RECTAL
  Filled 2012-09-08 (×4): qty 1

## 2012-09-08 NOTE — Progress Notes (Signed)
HISTORY  Dustin Roy is a 79 y.o. male who had been having declining memory over the last year and especially the last 6 months per wife. He had been seen as a out patient by his PCP for his declining memory and offered a trial of Aricept. His wife was not sure about starting this medication due to fear of some of the side effects. He does have known B12 deficiency which he received one injection for but, per wife, this did not have a large effect and his levels did not increase. 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 Over the past two weeks prior to admission his wife noted his speech had become less coherent (at times normal and other times making no sense). Over the past two days prior to admission she could not understand what he was saying. The patient was admitted to the hospital due to falling in his bathroom and having a contusion on his head. Initial CT head showed moderate atrophy and no blood. Neurology was asked to see patient for evaluation of dementia and initiating Aricept.   An MRI on 09/04/2012 showed a small acute white matter lacunar infarct in the left frontal lobe.   SUBJECTIVE  The patient's wife is at the bedside this morning. She is concerned that the patient received a breakfast tray this morning when it appears obvious that he is not able to eat. The patient is minimally responsive and makes no attempt to communicate other than occasional moaning. No further seizure activity reported.  OBJECTIVE  Most recent Vital Signs:  Filed Vitals:    09/06/12 1753  09/06/12 2111  09/07/12 0133  09/07/12 0515   BP:  143/59  128/54  120/51  122/47   Pulse:  74  83  81  77   Temp:  98.3 F (36.8 C)  98.4 F (36.9 C)  98.2 F (36.8 C)  98.1 F (36.7 C)   TempSrc:  Oral  Oral  Oral  Axillary   Resp:  18  16  17  18    Height:       Weight:       SpO2:  99%  100%  100%  99%    CBG (last 3)   Recent Labs   09/06/12 1103    GLUCAP  155*    IV Fluid Intake:  .  dextrose 5 % and 0.9 % NaCl with KCl 20 mEq/L  100 mL/hr at 09/06/12 1540    MEDICATIONS  .  aspirin EC  81 mg  Oral  Daily   .  enoxaparin (LOVENOX) injection  40 mg  Subcutaneous  Q24H   .  levETIRAcetam  750 mg  Intravenous  Q12H   PRN:   Diet: Dysphagia 2 nectar thick liquids Activity: ambulate  DVT Prophylaxis: lovenox  CLINICALLY SIGNIFICANT STUDIES  Basic Metabolic Panel:   Recent Labs  Lab  09/02/12 2004  09/04/12 1010  09/07/12 0545   NA  138  135  138   K  3.4*  3.6  3.5   CL  100  97  104   CO2  28  28  28    GLUCOSE  115*  110*  129*   BUN  24*  15  19   CREATININE  1.16  0.92  0.94   CALCIUM  9.4  9.3  8.7   MG  --  1.8  --  Liver Function Tests:   Recent Labs  Lab  09/02/12 2004  09/04/12 1010   AST  25  21   ALT  16  12   ALKPHOS  120*  120*   BILITOT  0.4  0.5   PROT  6.5  6.0   ALBUMIN  3.5  3.0*    CBC:   Recent Labs  Lab  09/02/12 2004  09/04/12 1010  09/07/12 0545   WBC  10.9*  6.1  7.2   NEUTROABS  9.4*  --  --   HGB  12.9*  13.4  12.3*   HCT  36.8*  37.7*  35.5*   MCV  98.9  98.4  99.2   PLT  181  198  199    Coagulation:   Recent Labs  Lab  09/02/12 2004   LABPROT  13.2   INR  1.01    Cardiac Enzymes:   Recent Labs  Lab  09/03/12 0052  09/03/12 0650  09/03/12 1228   TROPONINI  <0.30  <0.30  <0.30    Urinalysis:   Recent Labs  Lab  09/02/12 2104   COLORURINE  YELLOW   LABSPEC  1.022   PHURINE  5.5   GLUCOSEU  NEGATIVE   HGBUR  TRACE*   BILIRUBINUR  NEGATIVE   KETONESUR  NEGATIVE   PROTEINUR  NEGATIVE   UROBILINOGEN  0.2   NITRITE  NEGATIVE   LEUKOCYTESUR  NEGATIVE    Lipid Panel    Component  Value  Date/Time    CHOL  140  09/07/2012 0545    LDL 70  HgbA1C  Urine Drug Screen:  No results found for this basename: labopia, cocainscrnur, labbenz, amphetmu, thcu, labbarb    Alcohol Level: No results found for this basename: ETH, in the last 168 hours   Mr  Brain Wo Contrast  09/04/2012 1. Small acute white matter lacunar infarct in the left frontal lobe. No mass effect or acute hemorrhage. 2. Mild for age signal changes elsewhere in the brain compatible with chronic small vessel disease.   CT of the brain Advanced atrophy and microvascular ischemic disease without acute intracranial process.   MRA of the brain - not performed  2D Echocardiogram EF 60%, LV fxn appears preserved.   Carotid Doppler Bilateral carotid artery duplex: Less than 39% ICA stenosis. Vertebral artery flow is antegrade.   CXR Mild left lower lobe atelectasis   EEG This is an abnormal EEG secondary to posterior background slowing. This finding may be seen with a diffuse gray matter disturbance that is etiologically nonspecific, but may include a dementia, among other possibilities.   EKG sinus rhythm left bundle branch block.  Therapy Recommendations CIR   Physical Exam  General - 79 year-old male, in bed, sleeping on his back, mouth open, minimally responsive, occasionally moaning. Heart - Regular rate and rhythm - no murmer Lungs - diffuse rhonchi Abdomen - Soft - non tender Extremities - Distal pulses intact - 1+ edema Skin - Warm and dry  NEUROLOGIC:   MENTAL STATUS: Patient minimally responsive, occasionally moaning, no meaningful communication, follows occasional one-step commands. CRANIAL NERVES: pupils equal and reactive to light,extraocular muscles intact with maximal cuing, face symmetric MOTOR: normal bulk and tone, Strength -the patient does not move either upper extremity. He is able to hold both feet off the bed against gravity. SENSORY: Responds minimally to deep pain COORDINATION: Unable to test  ASSESSMENT  [name] is a 79 y.o. male  with dementia, presenting with fall in bathroom and confusion. Initial scan showed small left lacunar infarct in frontal lobe. Patient has since had an episode of where he was arching his arms and was non  responsive in the room, witnessed by wife and staff; felt to be a seizure. On aspirin 81 mg orally every day prior to admission. Now on aspirin 81 mg orally every day for secondary stroke prevention. Stroke workup complete.  Hypertension  Hyperlipidemia, LDL 70 at goal.  Long term medication use  Dementia  Additional Seizure  DYS 2 diet nectar thick liquids Fever 100.1 - possible aspiration  Hospital day # 5    TREATMENT/PLAN  Continue aspirin 81 mg orally every day for secondary stroke prevention.  levetiracetam increased to 1000mg  Q 12 hours. His initial episode was felt to be stroke in nature with his further episodes more notable for seizures.  HgbA1C 5.8 CIR has been recommended, unclear if this will be appropriate. The patient is poorly responsive - will make n.p.o. for now per speech therapists recommendation yesterday. We will defer fever evaluation to the hospitalists.  Delton See PA-C [facility] Pager [phone] 09/08/2012, 9:28 AM   I evaluated and examined patient, reviewed records, labs and imaging, and agree with note and plan.  Patient remains lethargic. Febrile 100.1 Wife reports concerns about almost daily seizures that she described as very brief episodes of head down, unresponsiveness. Stated that the last episode was last night. No other neurological developments. Small right WM subcortical lacunar left frontal infarct on MRI 6/24. I thinks patient's lethargy most likely secondary to fever/infection. Subclinical seizures seem less likely but will go ahead and repeat EEG and also CT brain. Will follow up.  Wyatt Portela, MD

## 2012-09-08 NOTE — Progress Notes (Addendum)
[facility] PROGRESS NOTE  Dustin Roy ZOX:[id number] DOB: 1920-10-06 DOA: 09/02/2012 PCP: No primary provider on file.  Assessment/Plan:  Seizures. Possibly another brief seizure last night.  Keppra increased by neuro. EEG and MRI ordered. Somnolence likely postictal. CXR negative. UA pending. Lungs clear. WBC normal.  NPO until more alert.  CVA; per neuro  Low grade fever: see above.  dementia;   Right arm skin tear:  wound care nurse has consulted and made recommendations  Dysphagia waxing and waning related to level of alertness and postictal periods  Code Status: DNR Family Communication: none today Disposition Plan: SNF   Consultants: Neurology   Procedures:   Antibiotics:  None  HPI/Subjective: Unable. Per niece, was agitated earlier, wanting to go home and trying to get OOB  Objective: Filed Vitals:   09/08/12 0210 09/08/12 0531 09/08/12 1002 09/08/12 1407  BP: 169/73 162/72 170/81 150/70  Pulse: 93 99 90 92  Temp: 99 F (37.2 C) 100.1 F (37.8 C) 99.3 F (37.4 C) 98.3 F (36.8 C)  TempSrc: Axillary Axillary Axillary Axillary  Resp: 16 18 20 22   Height:      Weight:      SpO2:   98% 98%    Intake/Output Summary (Last 24 hours) at 09/08/12 1544 Last data filed at 09/08/12 0500  Gross per 24 hour  Intake      0 ml  Output    625 ml  Net   -625 ml   Filed Weights   09/03/12 0206  Weight: 61.8 kg (136 lb 3.9 oz)    Exam: General: in chair. Asleep and unarousable. Cardiovascular: Regular rhythm and rate, negative murmurs rubs gallops  Respiratory: Clear to auscultation bilaterally  Abdomen: Soft, nontender, nondistended plus bowel sounds  Extremities: Right arm with large skin tear  Data Reviewed: Basic Metabolic Panel:  Recent Labs Lab 09/02/12 2004 09/04/12 1010 09/07/12 0545  NA 138 135 138  K 3.4* 3.6 3.5  CL 100 97 104  CO2 28 28 28   GLUCOSE 115* 110* 129*  BUN 24* 15 19  CREATININE 1.16 0.92 0.94  CALCIUM 9.4  9.3 8.7  MG  --  1.8  --    Liver Function Tests:  Recent Labs Lab 09/02/12 2004 09/04/12 1010  AST 25 21  ALT 16 12  ALKPHOS 120* 120*  BILITOT 0.4 0.5  PROT 6.5 6.0  ALBUMIN 3.5 3.0*   No results found for this basename: LIPASE, AMYLASE,  in the last 168 hours No results found for this basename: AMMONIA,  in the last 168 hours CBC:  Recent Labs Lab 09/02/12 2004 09/04/12 1010 09/07/12 0545  WBC 10.9* 6.1 7.2  NEUTROABS 9.4*  --   --   HGB 12.9* 13.4 12.3*  HCT 36.8* 37.7* 35.5*  MCV 98.9 98.4 99.2  PLT 181 198 199   Cardiac Enzymes:  Recent Labs Lab 09/02/12 2004 09/03/12 0052 09/03/12 0650 09/03/12 1228  TROPONINI <0.30 <0.30 <0.30 <0.30   BNP (last 3 results) No results found for this basename: PROBNP,  in the last 8760 hours CBG:  Recent Labs Lab 09/06/12 1103  GLUCAP 155*    Recent Results (from the past 240 hour(s))  URINE CULTURE     Status: None   Collection Time    09/04/12  1:54 PM      Result Value Range Status   Specimen Description URINE, CLEAN CATCH   Final   Special Requests NONE   Final   Culture  Setup Time  09/04/2012 23:47   Final   Colony Count NO GROWTH   Final   Culture NO GROWTH   Final   Report Status 09/05/2012 FINAL   Final    EEG from 24 June;This is an abnormal EEG secondary to posterior background slowing. This finding may be seen with a diffuse gray matter disturbance that is etiologically nonspecific, but may include a dementia, among other possibilities.  Echo: Left ventricle: The cavity size was normal. Systolic function was normal. The estimated ejection fraction was in the range of 55% to 60%. Doppler parameters are consistent with abnormal left ventricular relaxation (grade 1 diastolic dysfunction). - Ventricular septum: Septal motion showed abnormal function and dyssynergy. - Mitral valve: Calcified annulus. - Pericardium, extracardiac: A trivial pericardial effusion was identified. Impressions:  -  Extremely limited; LV function appears to be preserved; cannot R/O SOE with this study; suggest TEE to better assess if clinically indicated.  Bilateral carotid artery duplex: Less than 39% ICA stenosis. Vertebral artery flow is antegrade.    Dg Chest Port 1 View  09/08/2012   *RADIOLOGY REPORT*  Clinical Data: Fever and hypertension.  PORTABLE CHEST - 1 VIEW  Comparison: One-view chest 09/02/2012  Findings: The heart size is normal.  The lungs are clear. Aeration has improved at the left lung base.  The lung volumes are somewhat low.  Degenerative changes are noted in the shoulders bilaterally.  IMPRESSION:  1.  No acute cardiopulmonary disease. 2.  Interval clearing of the left lung base.   Original Report Authenticated By: Marin Roberts, M.D.    Scheduled Meds: . aspirin  300 mg Rectal Daily  . enoxaparin (LOVENOX) injection  40 mg Subcutaneous Q24H  . levETIRAcetam  1,000 mg Intravenous Q12H   Continuous Infusions: . dextrose 5 % and 0.9 % NaCl with KCl 20 mEq/L 100 mL/hr at 09/08/12 0106    Time spent: 35 minutes  Darl Brisbin L  [facility] Pager [phone]. If 7PM-7AM, please contact night-coverage at [email or web address], password [email or web address] 09/08/2012, 3:44 PM  LOS: 6 days

## 2012-09-08 NOTE — Progress Notes (Addendum)
SLP Cancellation Note  Patient Details Name: [name] MRN: [id number] DOB: 12-11-1920   Cancelled treatment:    Attempt x1 to evaluate swallow to possibly resume PO's.  Not completed due to patient presenting with lethargy with inability to participate fully.  Please page SLP if LOA improves.    Moreen Fowler MS, CCC-SLP [phone] [facility] 09/08/2012, 12:38 PM

## 2012-09-08 NOTE — Progress Notes (Addendum)
Speech Language Pathology Dysphagia Treatment Patient Details Name: Dustin Roy MRN: [id number] DOB: 1921/03/11 Today's Date: 09/08/2012 Time: 1330-1400 SLP Time Calculation (min): 30 min  Assessment / Plan / Recommendation Clinical Impression  RN paged SLP to reassess swallow as patient more alert in comparison with am attempt.  Patient sitting upright in  recliner throughout treatment with family member present.   Oral care administered prior to PO trials by RN.  Trials limited to nectar thick liquids administered by spoon x2  with majority of bolus spilling out anterior on right.  Minimal amount swallowed resulted in wet vocal quality indicating possible penetration. No further PO trials administered as patient unable to maintain LOA .  Recommend continued NPO status as patient presents with reduced ability to protect airway with PO's.  ST to reassess swallow in am as hopeful patient can resume PO's as alertness improves.     Diet Recommendation  Continue with Current Diet: NPO    SLP Plan Continue with current plan of care      Swallowing Goals  SLP Swallowing Goals Swallow Study Goal #1 - Progress: Not Met  General Respiratory Status: Room air Behavior/Cognition: Confused;Lethargic;Distractible;Requires cueing Oral Cavity - Dentition: Adequate natural dentition Patient Positioning: Upright in chair  Oral Cavity - Oral Hygiene Does patient have any of the following "at risk" factors?: Other - dysphagia;Diet - patient on thickened liquids;Nutritional status - inadequate Patient is HIGH RISK - Oral Care Protocol followed (see row info): Yes Patient is AT RISK - Oral Care Protocol followed (see row info): Yes   Dysphagia Treatment Treatment focused on: Upgraded PO texture trials;Patient/family/caregiver education;Facilitation of oral phase;Facilitation of pharyngeal phase Family/Caregiver Educated: Niece Treatment Methods/Modalities: Skilled observation;Differential  diagnosis Patient observed directly with PO's: Yes Type of PO's observed: Nectar-thick liquids Feeding: Total assist Liquids provided via: Teaspoon Oral Phase Signs & Symptoms: Anterior loss/spillage Pharyngeal Phase Signs & Symptoms: Suspected delayed swallow initiation;Multiple swallows;Wet vocal quality Type of cueing: Verbal Amount of cueing: Maximal   GO    Moreen Fowler MS, CCC-SLP [phone] [facility] 09/08/2012, 5:17 PM

## 2012-09-09 ENCOUNTER — Inpatient Hospital Stay (HOSPITAL_COMMUNITY): Payer: Medicare Other

## 2012-09-09 LAB — GLUCOSE, CAPILLARY: Glucose-Capillary: 150 mg/dL — ABNORMAL HIGH (ref 70–99)

## 2012-09-09 NOTE — Progress Notes (Signed)
[facility] PROGRESS NOTE  Dustin Roy ZOX:[id number] DOB: 11/25/1920 DOA: 09/02/2012 PCP: No primary provider on file.  Assessment/Plan:  Continued somnolence has made it impossible for patient to eat safely or participate in therapy.  No evidence of infection. Discussed with neuro:  Subclinical seizures v. Medication effect from keppra. Await EEG.  Will need NGT soon if patient remains unsafe to eat.  Per wife, no permanent tube feed  Seizures. See above  CVA;  dementia;   Dysphagia   Code Status: DNR Family Communication: wife Disposition Plan: SNF   Consultants: Neurology   Procedures:   Antibiotics:  None  HPI/Subjective: Per wife, agitated earlier.   Objective: Filed Vitals:   09/08/12 2118 09/09/12 0240 09/09/12 0605 09/09/12 1000  BP: 137/59 188/71 169/62 161/43  Pulse: 90 99 105 98  Temp: 97.9 F (36.6 C) 98.4 F (36.9 C) 97.2 F (36.2 C) 98.6 F (37 C)  TempSrc: Axillary Axillary Axillary Oral  Resp: 18 20 20 18   Height:      Weight:      SpO2: 99% 98% 98% 97%    Intake/Output Summary (Last 24 hours) at 09/09/12 1250 Last data filed at 09/09/12 1004  Gross per 24 hour  Intake      0 ml  Output   1000 ml  Net  -1000 ml   Filed Weights   09/03/12 0206  Weight: 61.8 kg (136 lb 3.9 oz)    Exam: General: in bed asleep. Cardiovascular: Regular rhythm and rate, negative murmurs rubs gallops  Respiratory: Clear to auscultation bilaterally  Abdomen: Soft, nontender, nondistended plus bowel sounds  Extremities: no cce  Data Reviewed: Basic Metabolic Panel:  Recent Labs Lab 09/02/12 2004 09/04/12 1010 09/07/12 0545 09/08/12 1746  NA 138 135 138 139  K 3.4* 3.6 3.5 3.7  CL 100 97 104 105  CO2 28 28 28 27   GLUCOSE 115* 110* 129* 136*  BUN 24* 15 19 12   CREATININE 1.16 0.92 0.94 0.94  CALCIUM 9.4 9.3 8.7 9.1  MG  --  1.8  --   --    Liver Function Tests:  Recent Labs Lab 09/02/12 2004 09/04/12 1010  AST 25 21  ALT  16 12  ALKPHOS 120* 120*  BILITOT 0.4 0.5  PROT 6.5 6.0  ALBUMIN 3.5 3.0*   No results found for this basename: LIPASE, AMYLASE,  in the last 168 hours  Recent Labs Lab 09/08/12 2120  AMMONIA 17   CBC:  Recent Labs Lab 09/02/12 2004 09/04/12 1010 09/07/12 0545 09/08/12 1746  WBC 10.9* 6.1 7.2 9.1  NEUTROABS 9.4*  --   --  6.9  HGB 12.9* 13.4 12.3* 13.0  HCT 36.8* 37.7* 35.5* 36.9*  MCV 98.9 98.4 99.2 99.5  PLT 181 198 199 211   Cardiac Enzymes:  Recent Labs Lab 09/02/12 2004 09/03/12 0052 09/03/12 0650 09/03/12 1228  TROPONINI <0.30 <0.30 <0.30 <0.30   BNP (last 3 results) No results found for this basename: PROBNP,  in the last 8760 hours CBG:  Recent Labs Lab 09/06/12 1103 09/09/12 1019  GLUCAP 155* 150*    Recent Results (from the past 240 hour(s))  URINE CULTURE     Status: None   Collection Time    09/04/12  1:54 PM      Result Value Range Status   Specimen Description URINE, CLEAN CATCH   Final   Special Requests NONE   Final   Culture  Setup Time 09/04/2012 23:47   Final  Colony Count NO GROWTH   Final   Culture NO GROWTH   Final   Report Status 09/05/2012 FINAL   Final    EEG from 24 June;This is an abnormal EEG secondary to posterior background slowing. This finding may be seen with a diffuse gray matter disturbance that is etiologically nonspecific, but may include a dementia, among other possibilities.  Echo: Left ventricle: The cavity size was normal. Systolic function was normal. The estimated ejection fraction was in the range of 55% to 60%. Doppler parameters are consistent with abnormal left ventricular relaxation (grade 1 diastolic dysfunction). - Ventricular septum: Septal motion showed abnormal function and dyssynergy. - Mitral valve: Calcified annulus. - Pericardium, extracardiac: A trivial pericardial effusion was identified. Impressions:  - Extremely limited; LV function appears to be preserved; cannot R/O SOE with  this study; suggest TEE to better assess if clinically indicated.  Bilateral carotid artery duplex: Less than 39% ICA stenosis. Vertebral artery flow is antegrade.    Dg Chest Port 1 View  09/08/2012   *RADIOLOGY REPORT*  Clinical Data: Fever and hypertension.  PORTABLE CHEST - 1 VIEW  Comparison: One-view chest 09/02/2012  Findings: The heart size is normal.  The lungs are clear. Aeration has improved at the left lung base.  The lung volumes are somewhat low.  Degenerative changes are noted in the shoulders bilaterally.  IMPRESSION:  1.  No acute cardiopulmonary disease. 2.  Interval clearing of the left lung base.   Original Report Authenticated By: Marin Roberts, M.D.    Scheduled Meds: . aspirin  300 mg Rectal Daily  . enoxaparin (LOVENOX) injection  40 mg Subcutaneous Q24H  . levETIRAcetam  1,000 mg Intravenous Q12H  . levothyroxine  25 mcg Intravenous Daily   Continuous Infusions: . dextrose 5 % and 0.9 % NaCl with KCl 20 mEq/L 100 mL/hr at 09/09/12 1004    Time spent: 35 minutes  Percy Comp L  [facility] Pager [phone]. If 7PM-7AM, please contact night-coverage at [email or web address], password [facility] 09/09/2012, 12:50 PM  LOS: 7 days

## 2012-09-09 NOTE — Progress Notes (Addendum)
HISTORY  Dustin Roy is a 79 y.o. male who had been having declining memory over the last year and especially the last 6 months per wife. He had been seen as a out patient by his PCP for his declining memory and offered a trial of Aricept. His wife was not sure about starting this medication due to fear of some of the side effects. He does have known B12 deficiency which he received one injection for but, per wife, this did not have a large effect and his levels did not increase. 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 Over the past two weeks prior to admission his wife noted his speech had become less coherent (at times normal and other times making no sense). Over the past two days prior to admission she could not understand what he was saying. The patient was admitted to the hospital due to falling in his bathroom and having a contusion on his head. Initial CT head showed moderate atrophy and no blood. Neurology was asked to see patient for evaluation of dementia and initiating Aricept.   An MRI on 09/04/2012 showed a small acute white matter lacunar infarct in the left frontal lobe.   SUBJECTIVE  The patient's wife is in the room this morning. She reports the patient had a better day yesterday. She is not aware of any seizure activity since Friday. The patient is sleeping and difficult to arouse. The patient's wife believes that the Keppra is causing the patient to be sedated.  OBJECTIVE  Most recent Vital Signs:  Filed Vitals:    09/06/12 1753  09/06/12 2111  09/07/12 0133  09/07/12 0515   BP:  143/59  128/54  120/51  122/47   Pulse:  74  83  81  77   Temp:  98.3 F (36.8 C)  98.4 F (36.9 C)  98.2 F (36.8 C)  98.1 F (36.7 C)   TempSrc:  Oral  Oral  Oral  Axillary   Resp:  18  16  17  18    Height:       Weight:       SpO2:  99%  100%  100%  99%    CBG (last 3)   Recent Labs   09/06/12 1103   GLUCAP  155*    IV Fluid  Intake:  .  dextrose 5 % and 0.9 % NaCl with KCl 20 mEq/L  100 mL/hr at 09/06/12 1540    MEDICATIONS  .  aspirin EC  81 mg  Oral  Daily   .  enoxaparin (LOVENOX) injection  40 mg  Subcutaneous  Q24H   .  levETIRAcetam  750 mg  Intravenous  Q12H   PRN:   Diet: NPO Activity: ambulate  DVT Prophylaxis: lovenox  CLINICALLY SIGNIFICANT STUDIES  Basic Metabolic Panel:   Recent Labs  Lab  09/02/12 2004  09/04/12 1010  09/07/12 0545   NA  138  135  138   K  3.4*  3.6  3.5   CL  100  97  104   CO2  28  28  28    GLUCOSE  115*  110*  129*   BUN  24*  15  19   CREATININE  1.16  0.92  0.94   CALCIUM  9.4  9.3  8.7   MG  --  1.8  --    Liver Function Tests:   Recent  Labs  Lab  09/02/12 2004  09/04/12 1010   AST  25  21   ALT  16  12   ALKPHOS  120*  120*   BILITOT  0.4  0.5   PROT  6.5  6.0   ALBUMIN  3.5  3.0*    CBC:   Recent Labs  Lab  09/02/12 2004  09/04/12 1010  09/07/12 0545   WBC  10.9*  6.1  7.2   NEUTROABS  9.4*  --  --   HGB  12.9*  13.4  12.3*   HCT  36.8*  37.7*  35.5*   MCV  98.9  98.4  99.2   PLT  181  198  199    Coagulation:   Recent Labs  Lab  09/02/12 2004   LABPROT  13.2   INR  1.01    Cardiac Enzymes:   Recent Labs  Lab  09/03/12 0052  09/03/12 0650  09/03/12 1228   TROPONINI  <0.30  <0.30  <0.30    Urinalysis:   Recent Labs  Lab  09/02/12 2104   COLORURINE  YELLOW   LABSPEC  1.022   PHURINE  5.5   GLUCOSEU  NEGATIVE   HGBUR  TRACE*   BILIRUBINUR  NEGATIVE   KETONESUR  NEGATIVE   PROTEINUR  NEGATIVE   UROBILINOGEN  0.2   NITRITE  NEGATIVE   LEUKOCYTESUR  NEGATIVE    Lipid Panel    Component  Value  Date/Time    CHOL  140  09/07/2012 0545    LDL 70  HgbA1C  Urine Drug Screen:  No results found for this basename: labopia, cocainscrnur, labbenz, amphetmu, thcu, labbarb    Alcohol Level: No results found for this basename: ETH, in the last 168 hours   Mr Brain Wo Contrast  09/04/2012 1. Small acute white matter  lacunar infarct in the left frontal lobe. No mass effect or acute hemorrhage. 2. Mild for age signal changes elsewhere in the brain compatible with chronic small vessel disease.   CT of the brain Advanced atrophy and microvascular ischemic disease without acute intracranial process.   MRA of the brain - not performed  2D Echocardiogram EF 60%, LV fxn appears preserved.   Carotid Doppler Bilateral carotid artery duplex: Less than 39% ICA stenosis. Vertebral artery flow is antegrade.   CXR Mild left lower lobe atelectasis   CXR  09/08/2012 - No acute cardiopulmonary disease. Interval clearing of the left lung base.   EEG This is an abnormal EEG secondary to posterior background slowing. This finding may be seen with a diffuse gray matter disturbance that is etiologically nonspecific, but may include a dementia, among other possibilities.   EKG sinus rhythm left bundle branch block.  Therapy Recommendations CIR   Physical Exam   NEUROLOGIC:   MENTAL STATUS: The patient is sleeping and difficult to arouse. He opens his eyes to painful stimulation but does not follow any commands. CRANIAL NERVES: pupils equal and reactive to light MOTOR: normal bulk and tone SENSORY: Responds minimally to deep pain COORDINATION: Unable to test  ASSESSMENT  Dustin Roy is a 79 y.o. male with dementia, presenting with fall in bathroom and confusion. Initial scan showed small left lacunar infarct in frontal lobe. Patient has since had an episode of where he was arching his arms and was non responsive in the room, witnessed by wife and staff; felt to be a seizure. On aspirin 81 mg orally every day prior  to admission. Now on aspirin 81 mg orally every day for secondary stroke prevention. Stroke workup complete.  Hypertension  Hyperlipidemia, LDL 70 at goal.  Long term medication use  Dementia  No further seizures reported since Friday Continue n.p.o. status per speech therapy Fever resolved -  chest x-ray improved.  Hospital day # 5    TREATMENT/PLAN  Continue aspirin 81 mg orally every day for secondary stroke prevention.  levetiracetam increased to 1000mg  Q 12 hours. His initial episode was felt to be stroke in nature with his further episodes more notable for seizures.  HgbA1C 5.8 CIR has been recommended, unclear if this will be appropriate. MRI and repeat EEG pending   Hassel Neth [facility] Pager [phone] 09/09/2012, 9:03 AM   Patient seen and examined together with physician assistant and I concur with the assessment and plan.  Wyatt Portela, MD   Addendum: Spoke with Dr. Lendell Caprice regarding patient's persistent lethargy despite no metabolic derangements or evidence of infection. She is concerned that Keppra could be playing a role on patient's altered mental status. Suggested MRI brain and EEG and then decide what to do with keppra.

## 2012-09-09 NOTE — Progress Notes (Signed)
Speech Language Pathology Dysphagia Treatment Patient Details Name: Dustin Roy MRN: [id number] DOB: 11/05/1920 Today's Date: 09/09/2012 Time: 1100-1130 SLP Time Calculation (min): 30 min  Assessment / Plan / Recommendation Clinical Impression  Diagnostic treatment completed to reassess swallow to possibly resume PO's.   No trials administered due to patient continuing with lethargy with inability to participate fully.  Patient opened eyes briefly during completion of oral care. Initially, patient appeared to be sleeping soundly but would spontaneously sit upright in bed briefly with occasional groan and return to reclined position.  Recommend to continue NPO status due to continued decreased LOA.  May want to consider temporary means of nutrition and hydration if mentation does not improve.  ST to follow for POC.      Diet Recommendation  Continue with Current Diet: NPO    SLP Plan Continue with current plan of care      Swallowing Goals  SLP Swallowing Goals Goal #3: Patient will maintain LOA to consume diagnostic PO trials to determine PO readiness.   General Temperature Spikes Noted: No Respiratory Status: Room air Behavior/Cognition: Agitated;Impulsive;Lethargic;Distractible Oral Cavity - Dentition: Adequate natural dentition Patient Positioning: Upright in bed  Oral Cavity - Oral Hygiene Does patient have any of the following "at risk" factors?: Nutritional status - inadequate Patient is HIGH RISK - Oral Care Protocol followed (see row info): Yes Patient is AT RISK - Oral Care Protocol followed (see row info): Yes   Dysphagia Treatment Treatment focused on: Patient/family/caregiver education Family/Caregiver Educated: Spouse Treatment Methods/Modalities: Skilled observation Patient observed directly with PO's: No Reason PO's not observed: Selmer Dominion MS, CCC-SLP [phone] [facility] 09/09/2012, 1:58 PM

## 2012-09-10 ENCOUNTER — Inpatient Hospital Stay (HOSPITAL_COMMUNITY): Payer: Medicare Other

## 2012-09-10 MED ORDER — SODIUM CHLORIDE 0.9 % IV SOLN
300.0000 mg | Freq: Once | INTRAVENOUS | Status: AC
  Administered 2012-09-10: 300 mg via INTRAVENOUS
  Filled 2012-09-10 (×2): qty 6

## 2012-09-10 MED ORDER — PHENYTOIN SODIUM 50 MG/ML IJ SOLN
100.0000 mg | Freq: Three times a day (TID) | INTRAMUSCULAR | Status: DC
  Administered 2012-09-11: 100 mg via INTRAVENOUS
  Filled 2012-09-10 (×4): qty 2

## 2012-09-10 NOTE — Progress Notes (Signed)
Stroke Team Progress Note  HISTORY Dustin Roy is a 79 y.o. male who had been having declining memory over the last year and especially the last 6 months per wife. He had been seen as a out patient by his PCP for his declining memory and offered a trial of Aricept. His wife was not sure about starting this medication due to fear of some of the side effects. He does have known B12 deficiency which he received one injection for but, per wife, this did not have a large effect and his levels did not increase. 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 Over the past two weeks prior to admission his wife noted his speech had become less coherent (at times normal and other times making no sense). Over the past two days prior to admission she could not understand what he was saying. The patient was admitted to the hospital due to falling in his bathroom and having a contusion on his head. Initial CT head showed moderate atrophy and no blood. Neurology was asked to see patient for evaluation of dementia and initiating Aricept.   An MRI on 09/04/2012 showed a small acute white matter lacunar infarct in the left frontal lobe.  SUBJECTIVE His wife is at the bedside.  Overall he feels his condition is sleepy. She is concerned about his knee swelling.   OBJECTIVE Most recent Vital Signs: Filed Vitals:   09/09/12 1000 09/10/12 0203 09/10/12 0617 09/10/12 1059  BP: 161/43 151/63 158/68 167/70  Pulse: 98 99 95 90  Temp: 98.6 F (37 C) 99.2 F (37.3 C) 97.6 F (36.4 C) 98.1 F (36.7 C)  TempSrc: Oral Oral Axillary Axillary  Resp: 18 16 18 18   Height:      Weight:      SpO2: 97% 97% 98% 100%   CBG (last 3)   Recent Labs  09/09/12 1019  GLUCAP 150*    IV Fluid Intake:   . dextrose 5 % and 0.9 % NaCl with KCl 20 mEq/L 100 mL/hr at 09/10/12 1040    MEDICATIONS  . aspirin  300 mg Rectal Daily  . enoxaparin (LOVENOX) injection  40 mg Subcutaneous  Q24H  . levETIRAcetam  1,000 mg Intravenous Q12H  . levothyroxine  25 mcg Intravenous Daily   PRN:  RESOURCE THICKENUP CLEAR  Diet:  NPO  Activity:   Up with assistance DVT Prophylaxis:  Lovenox 40 mg sq daily   CLINICALLY SIGNIFICANT STUDIES Basic Metabolic Panel:  Recent Labs Lab 09/04/12 1010 09/07/12 0545 09/08/12 1746  NA 135 138 139  K 3.6 3.5 3.7  CL 97 104 105  CO2 28 28 27   GLUCOSE 110* 129* 136*  BUN 15 19 12   CREATININE 0.92 0.94 0.94  CALCIUM 9.3 8.7 9.1  MG 1.8  --   --    Liver Function Tests:  Recent Labs Lab 09/04/12 1010  AST 21  ALT 12  ALKPHOS 120*  BILITOT 0.5  PROT 6.0  ALBUMIN 3.0*   CBC:  Recent Labs Lab 09/07/12 0545 09/08/12 1746  WBC 7.2 9.1  NEUTROABS  --  6.9  HGB 12.3* 13.0  HCT 35.5* 36.9*  MCV 99.2 99.5  PLT 199 211   Coagulation: No results found for this basename: LABPROT, INR,  in the last 168 hours Cardiac Enzymes:  Recent Labs Lab 09/03/12 1228  TROPONINI <0.30   Urinalysis:  Recent Labs Lab 09/08/12 2302  COLORURINE AMBER*  LABSPEC 1.021  PHURINE 5.5  GLUCOSEU NEGATIVE  HGBUR NEGATIVE  BILIRUBINUR NEGATIVE  KETONESUR NEGATIVE  PROTEINUR NEGATIVE  UROBILINOGEN 1.0  NITRITE NEGATIVE  LEUKOCYTESUR NEGATIVE   Lipid Panel    Component Value Date/Time   CHOL 140 09/07/2012 0545   TRIG 101 09/07/2012 0545   HDL 50 09/07/2012 0545   CHOLHDL 2.8 09/07/2012 0545   VLDL 20 09/07/2012 0545   LDLCALC 70 09/07/2012 0545   HgbA1C  Lab Results  Component Value Date   HGBA1C 5.8* 09/07/2012    Urine Drug Screen:   No results found for this basename: labopia, cocainscrnur, labbenz, amphetmu, thcu, labbarb    Alcohol Level: No results found for this basename: ETH,  in the last 168 hours  Mr Brain Wo Contrast  09/09/2012 matter infarction in the left frontal white matter inferior to the previously seen small cluster of infarctions. Those previously seen infarctions have not changed in any significant fashion. No  evidence of hemorrhage or mass effect. 09/04/2012 1. Small acute white matter lacunar infarct in the left frontal lobe. No mass effect or acute hemorrhage. 2. Mild for age signal changes elsewhere in the brain compatible with chronic small vessel disease.  CT of the brain Advanced atrophy and microvascular ischemic disease without acute intracranial process.  MRA of the brain - not performed  2D Echocardiogram EF 60%, LV fxn appears preserved.  Carotid Doppler Bilateral carotid artery duplex: Less than 39% ICA stenosis. Vertebral artery flow is antegrade.  CXR Mild left lower lobe atelectasis  CXR 09/08/2012 - No acute cardiopulmonary disease. Interval clearing of the left lung base.  EEG This is an abnormal EEG secondary to posterior background slowing. This finding may be seen with a diffuse gray matter disturbance that is etiologically nonspecific, but may include a dementia, among other possibilities.  EKG sinus rhythm left bundle branch block.   EEG CT was abnormal with mild to moderate generalized continuous nonspecific slowing of cerebral activity.  Therapy Recommendations CIR  Physical Exam  General: The patient is sleepy and lethargic at the time of the evaluation.  Skin: No significant peripheral edema is noted, with the exception that the right knee and ankle are swollen and painful.   Neurologic Exam  Cranial nerves: Facial symmetry is present. The patient is too drowsy to cooperate. He will blink to threat bilaterally. Non-verbal.  Motor: Poor motor effort of all 4's. Drift off both arms, pain with manipulation of the right leg.  Coordination: The patient would not cooperate for cerebellar testing.  Gait and station: The patient could not be ambulated.  Reflexes: Deep tendon reflexes are symmetric.    ASSESSMENT Dustin Roy is a 79 y.o. male with dementia, presenting with fall in bathroom and confusion. Initial scan showed small left lacunar infarct in frontal  lobe. Patient has since had an episode of where he was arching his arms and was non responsive in the room, witnessed by wife and staff; felt to be a seizure. On aspirin 81 mg orally every day prior to admission. Now on aspirin suppository for secondary stroke prevention. Stroke workup complete.   Extreme sedation most likely related to keppra. It is the most common side effect of keppra. Even low doses in elderly patients, especially those with dementia, can lead to significant lethargy. Hypertension  Hyperlipidemia, LDL 70 at goal.  Long term medication use  Dementia  No further seizures reported since Friday  Continue n.p.o. status per speech therapy  Fever resolved - chest  x-ray improved.  Hospital day # 8  TREATMENT/PLAN  Continue aspirin suppository for secondary stroke prevention. Change to plavix once able to swallow.  Change keppra to dilantin to see if sedation resolves  Hold tube feeding at this point to see if discontinuation of keppra helps resolve. Otherwise, will consider tube feedings in the am.  Xray right knee and ankle  Consider CIR at discharge  Annie Main, MSN, RN, ANVP-BC, ANP-BC, GNP-BC [facility] Pager: [phone] 09/10/2012 11:53 AM  I have personally obtained a history, examined the patient, evaluated imaging results, and formulated the assessment and plan of care. I agree with the above. Lesly Dukes

## 2012-09-10 NOTE — Clinical Social Work Note (Signed)
Clinical Social Worker continuing to follow patient and family for support and discharge planning needs.  Patient continues to have a bed available at [facility] once medically stable.  Patient remains NPO due to mental status with no plans for feeding tube at this time.  CSW will continue to follow patient and family for support and to facilitate patient discharge needs once medically ready.  Macario Golds, [place] [phone]

## 2012-09-10 NOTE — Progress Notes (Signed)
Physical Therapy Treatment Patient Details Name: Dustin Roy MRN: [id number] DOB: December 19, 1920 Today's Date: 09/10/2012 Time: 4098-1191 PT Time Calculation (min): 46 min  PT Assessment / Plan / Recommendation  PT Comments   The pt is not progressing well with therapy at this point due to lethargy.  MD is holding the Keppra to see if this will help.  He did arouse a little sitting EOB, but even that waxed and waned.   He was unable to stand fully upright or take steps for gait.  He continues to be appropriate for SNF level rehab at discharge and PT will continue to follow to try to progress mobility as he is more alert.    Follow Up Recommendations  SNF     Does the patient have the potential to tolerate intense rehabilitation    No  Barriers to Discharge   None      Equipment Recommendations  Rolling walker with 5" wheels    Recommendations for Other Services   None  Frequency   2 times per week  Progress towards PT Goals Progress towards PT goals: Not progressing toward goals - comment (pt was walking with therapy, now only transferring. )  Plan Current plan remains appropriate    Precautions / Restrictions Precautions Precautions: Fall Precaution Comments: right ankle and knee pain and swelling, x-rays (-) for fx.     Pertinent Vitals/Pain Grimacing with movement of right ankle, not so much with movement of right knee despite obvious swelling of this joint.     Mobility  Bed Mobility Supine to Sit: 1: +1 Total assist;HOB elevated Sitting - Scoot to Edge of Bed: 1: +1 Total assist Details for Bed Mobility Assistance: Pt did not initiate movement to sit up or scoot to EOB.   Transfers Sit to Stand: 2: Max assist;From bed;From elevated surface Stand to Sit: 2: Max assist;To chair/3-in-1 Squat Pivot Transfers: 2: Max assist;From elevated surface Details for Transfer Assistance: max assist from elevated bed towards pt's non-painful left side.  Assist needed to support trunk  over flexed and weak legs.  Ambulation/Gait Ambulation/Gait Assistance: Not tested (comment) (pt unable at this time.  )      PT Goals (current goals can now be found in the care plan section) Acute Rehab PT Goals Patient Stated Goal: pt unable- wife would like pt to return to independent mobility  Visit Information  Last PT Received On: 09/10/12 Assistance Needed: +2 History of Present Illness: 79 year old male found down at home in bathroom. PMH significant for dementia with declining memory x6-12 months, and poorly coherent speech x2 weeks.  MRI indicated small left frontal white matter lacunar infarct.  Pt also demonstrated seizure activity.    Subjective Data  Subjective: Pt's niece is in room, taking notes for wife as she stepped out to go home for a while.  Wife present at end of session.   Patient Stated Goal: pt unable- wife would like pt to return to independent mobility   Cognition  Cognition Arousal/Alertness: Lethargic Behavior During Therapy: Flat affect Overall Cognitive Status: Impaired/Different from baseline Area of Impairment: Memory;Attention;Orientation;Following commands;Safety/judgement;Awareness;Problem solving Orientation Level: Disoriented to;Place;Time;Situation Current Attention Level: Focused Memory: Decreased short-term memory Following Commands: Follows one step commands inconsistently Safety/Judgement: Decreased awareness of deficits;Decreased awareness of safety Awareness: Intellectual Problem Solving: Slow processing;Decreased initiation;Requires verbal cues;Requires tactile cues General Comments: Pt very hard to get aroused enough to assess cognition.  He did name and recognize his wife.   Difficult to assess  due to: Level of arousal    Balance  Static Sitting Balance Static Sitting - Balance Support: Feet supported;No upper extremity supported Static Sitting - Level of Assistance: 3: Mod assist Static Sitting - Comment/# of Minutes: mod  assist due to right lateral lean in sitting.  Sat EOB x 15 mins working on trying to increase pt's attention/arousal level.  He responded to painful stimuli in his sternal rub and right ankle the most.    End of Session PT - End of Session Equipment Utilized During Treatment: Gait belt Activity Tolerance: Patient limited by lethargy Patient left: in chair;with call bell/phone within reach;with family/visitor present (wife and niece) Nurse Communication: Mobility status;Need for lift equipment    Minerva Bluett B. Yuki Purves, PT, DPT [phone]   09/10/2012, 5:04 PM

## 2012-09-10 NOTE — Progress Notes (Addendum)
Discussed with neuro   [facility] PROGRESS NOTE  Dustin Roy:[id number] DOB: 18-Nov-77-1922 DOA: 09/02/2012 PCP: No primary provider on file.  Assessment/Plan:  Mental status improved off keppra. Monitor, and hopefully, alert enough to eat soon  Seizures. See above  CVA;  dementia;   Dysphagia   Code Status: DNR Family Communication: wife Disposition Plan: SNF   Consultants: Neurology   Procedures:   Antibiotics:  None  HPI/Subjective: Per wife, slightly more alert. Patient states he is thirsty  Objective: Filed Vitals:   09/10/12 1059 09/10/12 1400 09/10/12 1716 09/10/12 2227  BP: 167/70 147/52 148/66 112/82  Pulse: 90 90 84 97  Temp: 98.1 F (36.7 C) 98.2 F (36.8 C) 97.4 F (36.3 C) 99 F (37.2 C)  TempSrc: Axillary Axillary Axillary Oral  Resp: 18 18 18 18   Height:      Weight:      SpO2: 100% 100% 98% 99%    Intake/Output Summary (Last 24 hours) at 09/10/12 2355 Last data filed at 09/10/12 1000  Gross per 24 hour  Intake      0 ml  Output    500 ml  Net   -500 ml   Filed Weights   09/03/12 0206  Weight: 61.8 kg (136 lb 3.9 oz)    Exam: General: in chair. Asleep. Briefly arousable Cardiovascular: Regular rhythm and rate, negative murmurs rubs gallops  Respiratory: Clear to auscultation bilaterally  Abdomen: Soft, nontender, nondistended plus bowel sounds  Extremities: no cce  Data Reviewed: Basic Metabolic Panel:  Recent Labs Lab 09/04/12 1010 09/07/12 0545 09/08/12 1746  NA 135 138 139  K 3.6 3.5 3.7  CL 97 104 105  CO2 28 28 27   GLUCOSE 110* 129* 136*  BUN 15 19 12   CREATININE 0.92 0.94 0.94  CALCIUM 9.3 8.7 9.1  MG 1.8  --   --    Liver Function Tests:  Recent Labs Lab 09/04/12 1010  AST 21  ALT 12  ALKPHOS 120*  BILITOT 0.5  PROT 6.0  ALBUMIN 3.0*   No results found for this basename: LIPASE, AMYLASE,  in the last 168 hours  Recent Labs Lab 09/08/12 2120  AMMONIA 17   CBC:  Recent  Labs Lab 09/04/12 1010 09/07/12 0545 09/08/12 1746  WBC 6.1 7.2 9.1  NEUTROABS  --   --  6.9  HGB 13.4 12.3* 13.0  HCT 37.7* 35.5* 36.9*  MCV 98.4 99.2 99.5  PLT 198 199 211   Cardiac Enzymes: No results found for this basename: CKTOTAL, CKMB, CKMBINDEX, TROPONINI,  in the last 168 hours BNP (last 3 results) No results found for this basename: PROBNP,  in the last 8760 hours CBG:  Recent Labs Lab 09/06/12 1103 09/09/12 1019  GLUCAP 155* 150*    Recent Results (from the past 240 hour(s))  URINE CULTURE     Status: None   Collection Time    09/04/12  1:54 PM      Result Value Range Status   Specimen Description URINE, CLEAN CATCH   Final   Special Requests NONE   Final   Culture  Setup Time 09/04/2012 23:47   Final   Colony Count NO GROWTH   Final   Culture NO GROWTH   Final   Report Status 09/05/2012 FINAL   Final    EEG from 24 June;This is an abnormal EEG secondary to posterior background slowing. This finding may be seen with a diffuse gray matter disturbance that is etiologically nonspecific, but  may include a dementia, among other possibilities.  Echo: Left ventricle: The cavity size was normal. Systolic function was normal. The estimated ejection fraction was in the range of 55% to 60%. Doppler parameters are consistent with abnormal left ventricular relaxation (grade 1 diastolic dysfunction). - Ventricular septum: Septal motion showed abnormal function and dyssynergy. - Mitral valve: Calcified annulus. - Pericardium, extracardiac: A trivial pericardial effusion was identified. Impressions:  - Extremely limited; LV function appears to be preserved; cannot R/O SOE with this study; suggest TEE to better assess if clinically indicated.  Bilateral carotid artery duplex: Less than 39% ICA stenosis. Vertebral artery flow is antegrade.    Dg Knee 1-2 Views Right  09/10/2012   *RADIOLOGY REPORT*  Clinical Data: 79 year old male status post fall with pain and  swelling limited range of motion.  RIGHT KNEE - 1-2 VIEW  Comparison: None.  Findings: Moderate suprapatellar joint effusion.  No light of hemarthrosis evident on the cross-table lateral view.  Patella appears intact.  No definite acute fracture or dislocation of the right knee.  Calcified atherosclerosis.  IMPRESSION: Moderate joint effusion.  No definite acute fracture or dislocation at the right knee.   Original Report Authenticated By: Erskine Speed, M.D.   Dg Ankle 2 Views Right  09/10/2012   *RADIOLOGY REPORT*  Clinical Data: 79 year old male status post fall with pain and swelling and limited range of motion.  RIGHT ANKLE - 2 VIEW  Comparison: None.  Findings: Soft tissue swelling about the lateral ankle.  No joint effusion is evident.  Calcaneus intact.  Mortise joint alignment preserved.  No acute fracture identified.  IMPRESSION: Lateral soft tissue swelling. No acute fracture or dislocation identified about the right ankle.   Original Report Authenticated By: Erskine Speed, M.D.   Mr Brain Wo Contrast  09/09/2012   *RADIOLOGY REPORT*  Clinical Data: Altered mental status.  Follow-up left-sided infarction.  MRI HEAD WITHOUT CONTRAST  Technique:  Multiplanar, multiecho pulse sequences of the brain and surrounding structures were obtained according to standard protocol without intravenous contrast.  Comparison: Head CT 06/25.  MRI 06/24.  Findings: Small cluster of acute/subacute infarctions in the left posterior frontal cortical and subcortical brain appear the same. There is a new punctate infarction in the white matter inferior to that.  No other change.  Elsewhere, there are chronic small vessel changes affecting the pons.  No focal cerebellar insult.  The cerebral hemispheres show chronic small vessel changes throughout the white matter.  No cortical or large vessel territory infarction.  No mass lesion, hemorrhage, hydrocephalus or extra-axial collection.  No pituitary mass.  No inflammatory sinus  disease.  IMPRESSION: Since the study of 5 days ago, there is a single new punctate white matter infarction in the left frontal white matter inferior to the previously seen small cluster of infarctions.  Those previously seen infarctions have not changed in any significant fashion.  No evidence of hemorrhage or mass effect.   Original Report Authenticated By: Paulina Fusi, M.D.    Scheduled Meds: . aspirin  300 mg Rectal Daily  . enoxaparin (LOVENOX) injection  40 mg Subcutaneous Q24H  . levothyroxine  25 mcg Intravenous Daily  . [START ON 09/11/2012] phenytoin (DILANTIN) IV  100 mg Intravenous Q8H   Continuous Infusions: . dextrose 5 % and 0.9 % NaCl with KCl 20 mEq/L 100 mL/hr at 09/10/12 1040    Time spent: 25 minutes  Keyra Virella L  [facility] Pager [phone]. If 7PM-7AM, please contact night-coverage at [email or web address],  password [facility] 09/10/2012, 11:55 PM  LOS: 8 days

## 2012-09-10 NOTE — Procedures (Signed)
ELECTROENCEPHALOGRAM REPORT   Patient: Dustin Roy       Room #: 7W29 EEG No. ID: [id number] Age: 79 y.o.        Sex: male Referring Physician: Dr Leroy Kennedy Report Date:  09/10/2012        Interpreting Physician: Aline Brochure  History: Dustin Roy is an 79 y.o. male with progressive memory difficulty over the past one year, particularly noticeable in the past 6 months.    Indications for study:  Rule out slowing of cerebral activity.  Technique: This is an 18 channel routine scalp EEG performed at the bedside with bipolar and monopolar montages arranged in accordance to the international 10/20 system of electrode placement.   Description:EEG recording was performed during wakefulness and during drowsiness. Patient was noted to be confused during the study. Background activity consisted of continuous generalized 1-2 Hz symmetrical low amplitude delta activity with superimposed moderate amplitude irregular 6-7 Hz data activity. Brief photic stimulation showed no appreciable occipital driving response. Photic stimulation had to be discontinued, as the patient became significantly agitated during this part of the study. No epileptiform discharges were recorded.   Interpretation: CT was abnormal with mild to moderate generalized continuous nonspecific slowing of cerebral activity. This pattern slowing can be seen with metabolic as well as degenerative central nervous system disorders, including progressive dementia.  Venetia Maxon M.D. [facility] [phone]

## 2012-09-10 NOTE — Progress Notes (Signed)
Speech Language Pathology Dysphagia Treatment Patient Details Name: Dustin Roy MRN: [id number] DOB: May 12, 1920 Today's Date: 09/10/2012 Time: 4098-1191 SLP Time Calculation (min): 22 min  Assessment / Plan / Recommendation Clinical Impression  Treatment session focused on contextual and tactile cues to increase arousal. Pt upright, intermittently awake with max stimulation. Even when awake and responding to speech with y/n response, pt with loud snoring respirations. despite max effort (oral care, dry spoon to tongue, ice to lips pt would not close mouth. Pt did have a weak reflexive swallow reponse with open mouth posture. Pt has been many days without nutrition and arousal still poor; discussed with neuro team.  SLP will continue to follow. Hopeful that arousal will soon improve.     Diet Recommendation  Continue with Current Diet: NPO    SLP Plan Continue with current plan of care   Pertinent Vitals/Pain NA   Swallowing Goals  SLP Swallowing Goals Goal #3: Patient will maintain LOA to consume diagnostic PO trials to determine PO readiness.  Swallow Study Goal #3 - Progress: Progressing toward goal  General    Oral Cavity - Oral Hygiene     Dysphagia Treatment Treatment focused on: Patient/family/caregiver education Family/Caregiver Educated: Spouse Treatment Methods/Modalities: Skilled observation Patient observed directly with PO's: No Reason PO's not observed: Lethargic Type of cueing: Tactile;Verbal Amount of cueing: Total   GO    Harlon Ditty, [place] CCC-SLP [phone]  Claudine Mouton 09/10/2012, 12:19 PM

## 2012-09-10 NOTE — Progress Notes (Signed)
EEG Completed; Results Pending  

## 2012-09-11 DIAGNOSIS — F0391 Unspecified dementia with behavioral disturbance: Secondary | ICD-10-CM

## 2012-09-11 LAB — CREATININE, SERUM
Creatinine, Ser: 0.8 mg/dL (ref 0.50–1.35)
GFR calc Af Amer: 88 mL/min — ABNORMAL LOW (ref >90)
GFR calc non Af Amer: 76 mL/min — ABNORMAL LOW (ref >90)

## 2012-09-11 MED ORDER — CLOPIDOGREL BISULFATE 75 MG PO TABS
75.0000 mg | ORAL_TABLET | Freq: Every day | ORAL | Status: DC
  Administered 2012-09-11 – 2012-09-13 (×3): 75 mg via ORAL
  Filled 2012-09-11 (×4): qty 1

## 2012-09-11 MED ORDER — PHENYTOIN SODIUM EXTENDED 100 MG PO CAPS
300.0000 mg | ORAL_CAPSULE | Freq: Every day | ORAL | Status: DC
  Administered 2012-09-11 – 2012-09-12 (×2): 300 mg via ORAL
  Filled 2012-09-11 (×3): qty 3

## 2012-09-11 MED ORDER — ACETAMINOPHEN 325 MG PO TABS
650.0000 mg | ORAL_TABLET | Freq: Four times a day (QID) | ORAL | Status: DC | PRN
  Administered 2012-09-11 – 2012-09-13 (×4): 650 mg via ORAL
  Filled 2012-09-11 (×4): qty 2

## 2012-09-11 MED ORDER — HALOPERIDOL LACTATE 5 MG/ML IJ SOLN
1.0000 mg | Freq: Every evening | INTRAMUSCULAR | Status: DC | PRN
Start: 2012-09-11 — End: 2012-09-13
  Administered 2012-09-11: 1 mg via INTRAVENOUS
  Filled 2012-09-11: qty 1

## 2012-09-11 MED ORDER — LEVOTHYROXINE SODIUM 50 MCG PO TABS
50.0000 ug | ORAL_TABLET | Freq: Every day | ORAL | Status: DC
  Administered 2012-09-12 – 2012-09-13 (×2): 50 ug via ORAL
  Filled 2012-09-11 (×3): qty 1

## 2012-09-11 NOTE — Progress Notes (Signed)
Physical Therapy Treatment Patient Details Name: Dustin Roy MRN: [id number] DOB: Jul 16, 1920 Today's Date: 09/11/2012 Time: 4098-1191 PT Time Calculation (min): 25 min  PT Assessment / Plan / Recommendation  PT Comments   Entered patient's room because RN reports he is anxious to get up and mobilize. Seems very focused on finding his wife "Dustin Roy." Much improved from earlier session, standing up with modA and ambulating 25 ft with minA. Family very supportive. Would be worth considering CIR for intensive rehab prior to d/c home.   Follow Up Recommendations  CIR     Does the patient have the potential to tolerate intense rehabilitation     Barriers to Discharge        Equipment Recommendations  Rolling walker with 5" wheels    Recommendations for Other Services Rehab consult  Frequency Min 3X/week   Progress towards PT Goals Progress towards PT goals: Progressing toward goals  Plan Frequency needs to be updated;Discharge plan needs to be updated    Precautions / Restrictions Precautions Precautions: Fall Precaution Comments: right ankle is swollen and sore. Per wife, there are torn ligaments in both Required Braces or Orthoses: Other Brace/Splint (MD ordered ankle brace for right ankle) Other Brace/Splint: R ankle ASO Restrictions Weight Bearing Restrictions: No   Pertinent Vitals/Pain Points to his knee and ankle when asked about pain    Mobility  Bed Mobility Bed Mobility: Not assessed Rolling Right: 1: +1 Total assist Rolling Left: 1: +1 Total assist Supine to Sit: 3: Mod assist;With rails;HOB elevated Sitting - Scoot to Edge of Bed: 4: Min assist;With rail Details for Bed Mobility Assistance: mod assist to support trunk during transition.  Pt initiating movement nicely today.  Min assist to support trunk for balance while scooting to EOB using railing for leverage.  Transfers Transfers: Sit to Stand;Stand to Sit Sit to Stand: 3: Mod assist;With upper extremity  assist;From bed;From elevated surface Stand to Sit: 3: Mod assist;With upper extremity assist;With armrests;To chair/3-in-1 Stand Pivot Transfers: 3: Mod assist;From elevated surface Details for Transfer Assistance: upon entering patients room he is sitting on the leg rests of the recliner needing +2 to scoot his hips (with use of chuck pad) posteriorly into the chair in prep to stand; once in good position he was able to stand x2 with modA to provide facilitation for anterior translation of trunk over BOS and stabilize as he stood using RW Ambulation/Gait Ambulation/Gait Assistance: 4: Min assist Ambulation Distance (Feet): 25 Feet Assistive device: Rolling walker Ambulation/Gait Assistance Details: cues for tall posture and safe positioning of feet, following simple commands well, followed by nephew with chair, distracted by trying to find his wife (perseverating on [place]), stability assist throughout gait Gait Pattern: Antalgic;Lateral trunk lean to left;Decreased stride length;Decreased weight shift to right;Trunk flexed Gait velocity: decreased    Exercises General Exercises - Lower Extremity Long Arc QuadBarbaraann Roy;Both;5 reps;Seated    PT Goals (current goals can now be found in the care plan section) Acute Rehab PT Goals Patient Stated Goal: pt unable- wife would like pt to return to independent mobility  Visit Information  Last PT Received On: 09/11/12 Assistance Needed: +1 (+2 for chair follow) History of Present Illness: 79 year old male found down at home in bathroom. PMH significant for dementia with declining memory x6-12 months, and poorly coherent speech x2 weeks.  MRI indicated small left frontal white matter lacunar infarct.  Pt also demonstrated seizure activity.    Subjective Data  Subjective: Pt's nefew is in  the room today.  Pt is awake and alert, looking around the room, having difficulty expressing himself, but seems to understand what I ask of him.   Patient Stated  Goal: pt unable- wife would like pt to return to independent mobility   Cognition  Cognition Arousal/Alertness: Awake/alert Behavior During Therapy: Restless Overall Cognitive Status: Impaired/Different from baseline Area of Impairment: Attention;Memory;Following commands;Safety/judgement;Awareness;Problem solving Orientation Level: Disoriented to;Place;Person;Time;Situation Current Attention Level: Selective Memory: Decreased short-term memory Following Commands: Follows one step commands consistently Safety/Judgement: Decreased awareness of safety;Decreased awareness of deficits Awareness: Intellectual Problem Solving: Slow processing;Requires verbal cues;Requires tactile cues General Comments: Pt was very alert this PM, following simple commands and initiating all mobility.   Difficult to assess due to: Level of arousal    Balance  Static Sitting Balance Static Sitting - Balance Support: No upper extremity supported;Feet supported Static Sitting - Level of Assistance: 5: Stand by assistance Static Sitting - Comment/# of Minutes: supervision today, much improved over yesterday's session.   Static Standing Balance Static Standing - Balance Support: Bilateral upper extremity supported Static Standing - Level of Assistance: 4: Min assist Static Standing - Comment/# of Minutes: initially needing modA to prevent posterior LOB but quickly improved to minA, attempted to get him to stand with only RUE supported by the walker however he was resistant and fearful quickly replacing his hand to the walker  End of Session PT - End of Session Equipment Utilized During Treatment: Gait belt Activity Tolerance: Patient tolerated treatment well Patient left: in chair;with call bell/phone within reach;with family/visitor present Nurse Communication: Mobility status   GP     [facility] Dustin Roy 09/11/2012, 5:04 PM

## 2012-09-11 NOTE — Progress Notes (Signed)
[facility] PROGRESS NOTE  Dustin Roy VHQ:[id number] DOB: 1921/02/24 DOA: 09/02/2012 PCP: No primary provider on file.  Summary: Progressive confusion. Fell at home and initially thought to have concussion, but w/u shows new stroke. Had new seizure after admission. Very sedated on keppra, which has now been changed to dilantin. Eventually to [facility]  Assessment/Plan:  Mental status improving off keppra. Monitor on current diet. To [facility] soon if stable.  Seizures. On dilantin  CVA acute  dementia;   Dysphagia   Code Status: DNR Family Communication: wife Disposition Plan: SNF   Consultants: Neurology   Procedures:   Antibiotics:  None  HPI/Subjective: Ate a bit today, per family. Periods of agitation  Objective: Filed Vitals:   09/10/12 2227 09/11/12 0202 09/11/12 1053 09/11/12 1353  BP: 112/82 154/69 140/52 150/60  Pulse: 97 91 75 79  Temp: 99 F (37.2 C)  97.8 F (36.6 C) 97.9 F (36.6 C)  TempSrc: Oral Oral Oral Axillary  Resp: 18 20 20 18   Height:      Weight:      SpO2: 99% 100% 100% 97%    Intake/Output Summary (Last 24 hours) at 09/11/12 1840 Last data filed at 09/11/12 1736  Gross per 24 hour  Intake    240 ml  Output      0 ml  Net    240 ml   Filed Weights   09/03/12 0206  Weight: 61.8 kg (136 lb 3.9 oz)    Exam: General: alert, agitated confused. Cardiovascular: Regular rhythm and rate, negative murmurs rubs gallops  Respiratory: Clear to auscultation bilaterally  Abdomen: Soft, nontender, nondistended plus bowel sounds  Extremities: no cce  Data Reviewed: Basic Metabolic Panel:  Recent Labs Lab 09/07/12 0545 09/08/12 1746 09/11/12 0500  NA 138 139  --   K 3.5 3.7  --   CL 104 105  --   CO2 28 27  --   GLUCOSE 129* 136*  --   BUN 19 12  --   CREATININE 0.94 0.94 0.80  CALCIUM 8.7 9.1  --    Liver Function Tests: No results found for this basename: AST, ALT, ALKPHOS, BILITOT, PROT, ALBUMIN,  in the last  168 hours No results found for this basename: LIPASE, AMYLASE,  in the last 168 hours  Recent Labs Lab 09/08/12 2120  AMMONIA 17   CBC:  Recent Labs Lab 09/07/12 0545 09/08/12 1746  WBC 7.2 9.1  NEUTROABS  --  6.9  HGB 12.3* 13.0  HCT 35.5* 36.9*  MCV 99.2 99.5  PLT 199 211   Cardiac Enzymes: No results found for this basename: CKTOTAL, CKMB, CKMBINDEX, TROPONINI,  in the last 168 hours BNP (last 3 results) No results found for this basename: PROBNP,  in the last 8760 hours CBG:  Recent Labs Lab 09/06/12 1103 09/09/12 1019  GLUCAP 155* 150*    Recent Results (from the past 240 hour(s))  URINE CULTURE     Status: None   Collection Time    09/04/12  1:54 PM      Result Value Range Status   Specimen Description URINE, CLEAN CATCH   Final   Special Requests NONE   Final   Culture  Setup Time 09/04/2012 23:47   Final   Colony Count NO GROWTH   Final   Culture NO GROWTH   Final   Report Status 09/05/2012 FINAL   Final    EEG from 24 June;This is an abnormal EEG secondary to posterior background  slowing. This finding may be seen with a diffuse gray matter disturbance that is etiologically nonspecific, but may include a dementia, among other possibilities.  Echo: Left ventricle: The cavity size was normal. Systolic function was normal. The estimated ejection fraction was in the range of 55% to 60%. Doppler parameters are consistent with abnormal left ventricular relaxation (grade 1 diastolic dysfunction). - Ventricular septum: Septal motion showed abnormal function and dyssynergy. - Mitral valve: Calcified annulus. - Pericardium, extracardiac: A trivial pericardial effusion was identified. Impressions:  - Extremely limited; LV function appears to be preserved; cannot R/O SOE with this study; suggest TEE to better assess if clinically indicated.  Bilateral carotid artery duplex: Less than 39% ICA stenosis. Vertebral artery flow is antegrade.    Dg Knee 1-2  Views Right  09/10/2012   *RADIOLOGY REPORT*  Clinical Data: 79 year old male status post fall with pain and swelling limited range of motion.  RIGHT KNEE - 1-2 VIEW  Comparison: None.  Findings: Moderate suprapatellar joint effusion.  No light of hemarthrosis evident on the cross-table lateral view.  Patella appears intact.  No definite acute fracture or dislocation of the right knee.  Calcified atherosclerosis.  IMPRESSION: Moderate joint effusion.  No definite acute fracture or dislocation at the right knee.   Original Report Authenticated By: Erskine Speed, M.D.   Dg Ankle 2 Views Right  09/10/2012   *RADIOLOGY REPORT*  Clinical Data: 79 year old male status post fall with pain and swelling and limited range of motion.  RIGHT ANKLE - 2 VIEW  Comparison: None.  Findings: Soft tissue swelling about the lateral ankle.  No joint effusion is evident.  Calcaneus intact.  Mortise joint alignment preserved.  No acute fracture identified.  IMPRESSION: Lateral soft tissue swelling. No acute fracture or dislocation identified about the right ankle.   Original Report Authenticated By: Erskine Speed, M.D.    Scheduled Meds: . clopidogrel  75 mg Oral Q breakfast  . enoxaparin (LOVENOX) injection  40 mg Subcutaneous Q24H  . [START ON 09/12/2012] levothyroxine  50 mcg Oral QAC breakfast  . phenytoin  300 mg Oral QHS   Continuous Infusions:    Time spent: 25 minutes  Venesa Semidey L  [facility] Pager [phone]. If 7PM-7AM, please contact night-coverage at [email or web address], password [facility] 09/11/2012, 6:40 PM  LOS: 9 days

## 2012-09-11 NOTE — Progress Notes (Signed)
Occupational Therapy Treatment Patient Details Name: Dustin Roy MRN: [id number] DOB: 04-03-1920 Today's Date: 09/11/2012 Time: 7829-5621 OT Time Calculation (min): 63 min  OT Assessment / Plan / Recommendation  OT comments  Pt was very lethargic today. Pt with incr pain in ankle and knee. Pt unable to perform or assist with bed mobility (rolling right and left) Pt very motivated to eat, and was able to feed himself from a supine HOB elevated position. Pt with min assistance with keeping food on spoon at times. Pt continues to benefit from skilled OT in the acute setting. Pt still coming off medications which cause lethargy.    Follow Up Recommendations  SNF             Frequency Min 2X/week   Progress towards OT Goals Progress towards OT goals: Not progressing toward goals - comment (Pt in more pain today in knee and ankle)  Plan Discharge plan remains appropriate    Precautions / Restrictions Precautions Precautions: Fall Precaution Comments: right ankle is swollen and sore. Per wife, there are torn ligaments in both Restrictions Weight Bearing Restrictions: No   Pertinent Vitals/Pain Pt in pain in knee, and especially in ankle. Pt not able to rate. Repositioned for comfort.    ADL  Eating/Feeding: Minimal assistance Where Assessed - Eating/Feeding: Other (comment) (supine, HOB elevated) Grooming: Wash/dry face;Wash/dry hands;Minimal assistance Where Assessed - Grooming: Supine, head of bed up Upper Body Bathing: Moderate assistance Where Assessed - Upper Body Bathing: Supine, head of bed up Lower Body Bathing: +1 Total assistance Where Assessed - Lower Body Bathing: Supine, head of bed up Upper Body Dressing: +1 Total assistance Where Assessed - Upper Body Dressing: Supine, head of bed up Lower Body Dressing: +1 Total assistance Where Assessed - Lower Body Dressing: Supine, head of bed up Transfers/Ambulation Related to ADLs: No transfers attempted ADL Comments: When  OTS entered the room, the Pt had wet himself, and was a total assist for bed mobilization, and cleaning LB. Pt was able to use washcloth to clean face, arms, chest, private and hands. Pt able to feed himself from supine in bed with HOB elevated.  Pt also successful in drinking from cup. Pt with empty mouth and clear voice after swallowing. Pt needed assistance sometimes with keeping food on his spoon.      OT Goals(current goals can now be found in the care plan section) Acute Rehab OT Goals OT Goal Formulation: Patient unable to participate in goal setting Potential to Achieve Goals: Fair ADL Goals Pt Will Perform Eating: with modified independence Pt Will Perform Grooming: with supervision;sitting Pt Will Transfer to Toilet: with min assist;regular height toilet Pt Will Perform Toileting - Clothing Manipulation and hygiene: with min assist;with caregiver independent in assisting;sit to/from stand  Visit Information  Last OT Received On: 09/11/12 Assistance Needed: +2 History of Present Illness: 79 year old male found down at home in bathroom. PMH significant for dementia with declining memory x6-12 months, and poorly coherent speech x2 weeks.  MRI indicated small left frontal white matter lacunar infarct.  Pt also demonstrated seizure activity.          Cognition  Cognition Arousal/Alertness: Lethargic Behavior During Therapy: Flat affect Overall Cognitive Status: Impaired/Different from baseline Area of Impairment: Memory;Attention;Orientation;Following commands;Safety/judgement;Awareness;Problem solving Orientation Level: Disoriented to;Place;Time;Situation Current Attention Level: Selective Memory: Decreased short-term memory Following Commands: Follows one step commands inconsistently Safety/Judgement: Decreased awareness of deficits;Decreased awareness of safety Problem Solving: Slow processing;Decreased initiation;Requires verbal cues;Requires tactile cues General Comments:  Pt was very hard to arouse, but was very motivated by food. Pt still with incoherant speech. He was able to answer some questions, but not others.  Difficult to assess due to: Level of arousal    Mobility  Bed Mobility Bed Mobility: Rolling Left;Rolling Right Rolling Right: 1: +1 Total assist Rolling Left: 1: +1 Total assist Details for Bed Mobility Assistance: Pt did not initiate any movement to participate in bed mobility Transfers Transfers: Not assessed          End of Session OT - End of Session Activity Tolerance: Patient limited by fatigue;Patient limited by pain Patient left: in bed;with family/visitor present;with call bell/phone within reach;with bed alarm set Nurse Communication: Mobility status (Pt remote was not working to turn on the TV)  GO     Sherryl Manges 09/11/2012, 3:18 PM

## 2012-09-11 NOTE — Progress Notes (Signed)
I agree with the following treatment note after reviewing documentation.   Johnston, Kathelyn Gombos Brynn   OTR/L Pager: [phone] Office: [phone] .   

## 2012-09-11 NOTE — Progress Notes (Signed)
Speech Language Pathology Dysphagia Treatment Patient Details Name: Dustin Roy MRN: [id number] DOB: 12-19-1920 Today's Date: 09/11/2012 Time: 4098-1191 SLP Time Calculation (min): 39 min  Assessment / Plan / Recommendation Clinical Impression  Diagnostic treatment for PO readiness. Pt initially more alert but arousal and attention waned through session requiring max cues to attend to cup sips. When maximally alert pt consumed straw sips of thin liquids and masticated cracker with mild oral dysphagia, piecemeal deglution, anterior loss. No overt signs of aspiration. Given that pt struggles to [facility] arousal, recommend pt initiate a modified diet (dys 1 - puree) with nectar thick liquids, to allow for PO intake but decrease risk of aspiration. As arousal continues to improve, pt likely tolerate regular diet. Will continue to follow for tolerance.     Diet Recommendation  Initiate / Change Diet: Dysphagia 1 (puree);Nectar-thick liquid    SLP Plan Continue with current plan of care   Pertinent Vitals/Pain NA   Swallowing Goals  SLP Swallowing Goals Patient will utilize recommended strategies during swallow to increase swallowing safety with: Minimal cueing Swallow Study Goal #2 - Progress: Progressing toward goal Goal #3: Patient will maintain LOA to consume diagnostic PO trials to determine PO readiness.  Swallow Study Goal #3 - Progress: Progressing toward goal  General Temperature Spikes Noted: No Respiratory Status: Room air Behavior/Cognition: Lethargic;Cooperative;Requires cueing;Distractible;Decreased sustained attention Oral Cavity - Dentition: Adequate natural dentition Patient Positioning: Upright in bed  Oral Cavity - Oral Hygiene Patient is HIGH RISK - Oral Care Protocol followed (see row info): Yes   Dysphagia Treatment Treatment focused on: Patient/family/caregiver education;Skilled observation of diet tolerance;Upgraded PO texture trials;Utilization of compensatory  strategies Family/Caregiver Educated: Spouse Treatment Methods/Modalities: Differential diagnosis;Skilled observation Patient observed directly with PO's: Yes Type of PO's observed: Thin liquids;Nectar-thick liquids;Dysphagia 1 (puree);Dysphagia 3 (soft) Feeding: Needs assist Liquids provided via: Cup;Straw Oral Phase Signs & Symptoms: Anterior loss/spillage Pharyngeal Phase Signs & Symptoms: Multiple swallows;Suspected delayed swallow initiation Type of cueing: Verbal;Tactile;Visual Amount of cueing: Moderate   GO     Dustin Roy, Riley Nearing 09/11/2012, 10:25 AM

## 2012-09-11 NOTE — Progress Notes (Signed)
Orthopedic Tech Progress Note Patient Details:  Dustin Roy 1921/02/11 [id number]  Ortho Devices Type of Ortho Device: ASO Ortho Device/Splint Location: RLE Ortho Device/Splint Interventions: Ordered;Application   [name] 09/11/2012, 4:38 PM

## 2012-09-11 NOTE — Evaluation (Signed)
Speech Language Pathology Evaluation Patient Details Name: Dustin Roy MRN: [id number] DOB: 1921-02-16 Today's Date: 09/11/2012 Time: 4098-1191 SLP Time Calculation (min): 39 min  Problem List:  Patient Active Problem List   Diagnosis Date Noted  . Fever, unspecified 09/08/2012  . Skin tear of upper arm without complication 09/07/2012  . Seizure 09/05/2012  . Dementia 09/05/2012  . CVA (cerebral infarction) 09/04/2012  . Expressive aphasia 09/04/2012  . Concussion syndrome 09/03/2012  . Fall at home 09/03/2012  . HTN (hypertension) 09/03/2012  . Hypothyroidism 09/03/2012   Past Medical History:  Past Medical History  Diagnosis Date  . Hypertension   . Hypothyroid    Past Surgical History: History reviewed. No pertinent past surgical history. HPI:  79 year old male found down at home. PMH significant for dementia with declining memory x6-12 months, and poorly coherent speech x2 weeks.  MRI indicated small left frontal white matter lacunar infarct.  Pt also demonstrated seizure activity.  Pt has been very sleepy over the past 5 days due to seizure meds. Today pt is finally alert enough to participate in cognitive linguistic eval.    Assessment / Plan / Recommendation Clinical Impression  Pt demonstrates moderate cognitive linguistic deficits due to decreased LOA with intermittent focused attention and briefly sustained attention to verbal and functional tasks. With verbal expression pt mildly dysarthric when sleepy, also with occasional neologisms or paraphasis, though pt corrects with min cues to repeat. He is oriented to self, but cannot store new information due to lethargy. Pt likely to improve as arousal improves. Pt will benefit from acute SLP therapy for functional communication/cognition and may benefit from CIR to return home.     SLP Assessment  Patient needs continued Speech Lanaguage Pathology Services    Follow Up Recommendations  Inpatient Rehab    Frequency  and Duration min 2x/week  2 weeks   Pertinent Vitals/Pain NA   SLP Goals  SLP Goals Potential to Achieve Goals: Good Progress/Goals/Alternative treatment plan discussed with pt/caregiver and they: Agree SLP Goal #1: Pt will sustain attention to basic functional task with moderate verbal cues for 10 minutes.  SLP Goal #1 - Progress: Progressing toward goal SLP Goal #2: Pt will particpate in basic conversation for 2 minutes with min verbal cues to correct errors.  SLP Goal #2 - Progress: Progressing toward goal  SLP Evaluation Prior Functioning  Cognitive/Linguistic Baseline: Baseline deficits Baseline deficit details: mild memory deficits associted with early dementia Type of Home: House  Lives With: Spouse Available Help at Discharge: Family;Available 24 hours/day Vocation: Retired   IT consultant  Overall Cognitive Status: Impaired/Different from baseline Arousal/Alertness: Lethargic Orientation Level: Oriented to person;Disoriented to place;Disoriented to time;Disoriented to situation Attention: Focused;Sustained Focused Attention: Impaired Focused Attention Impairment: Verbal basic;Functional basic (intermittent) Sustained Attention: Impaired Sustained Attention Impairment: Verbal basic;Functional basic (30 seconds) Memory: Impaired Memory Impairment: Storage deficit;Retrieval deficit;Decreased recall of new information;Decreased short term memory Decreased Short Term Memory: Verbal basic;Functional basic Awareness: Impaired Awareness Impairment: Intellectual impairment;Emergent impairment Problem Solving: Impaired Problem Solving Impairment: Verbal basic;Functional basic Behaviors: Restless Safety/Judgment: Impaired    Comprehension  Auditory Comprehension Yes/No Questions: Impaired Basic Biographical Questions: 51-75% accurate Basic Immediate Environment Questions: 25-49% accurate Commands: Impaired One Step Basic Commands: 25-49% accurate Conversation:  Simple Interfering Components: Attention    Expression Verbal Expression Overall Verbal Expression: Impaired Initiation: No impairment Automatic Speech: Name;Social Response (could not participate in months, too lethargic) Level of Generative/Spontaneous Verbalization: Word;Phrase;Sentence Repetition: No impairment Naming: No impairment Pragmatics: No impairment Interfering  Components: Attention Other Verbal Expression Comments: occasional neologisms and paraphasias, pt corrects with min cues to repeat.    Oral / Motor Oral Motor/Sensory Function Overall Oral Motor/Sensory Function: Appears within functional limits for tasks assessed Motor Speech Overall Motor Speech: Impaired Respiration: Within functional limits Level of Impairment: Sentence Phonation: Normal Resonance: Within functional limits Articulation: Impaired Level of Impairment: Sentence Intelligibility: Intelligibility reduced Word: 75-100% accurate Phrase: 75-100% accurate Sentence: 50-74% accurate Conversation: 25-49% accurate Interfering Components:  (lethargy)   GO    Harlon Ditty, MA CCC-SLP [phone]]   Claudine Mouton 09/11/2012, 10:38 AM

## 2012-09-11 NOTE — Progress Notes (Signed)
Physical Therapy Treatment Patient Details Name: Dustin Roy MRN: [id number] DOB: May 04, 1920 Today's Date: 09/11/2012 Time: 4098-1191 PT Time Calculation (min): 44 min  PT Assessment / Plan / Recommendation  PT Comments   Pt with significant improvements in PT today compared to just yesterday.  He is awake, alert, following simple one step commands, initiating movement and taking gingerly steps with the RW.  I continue to recommend SNF at discharge as pt will need to be more independent to go home with his wife's assist.    Follow Up Recommendations  SNF     Does the patient have the potential to tolerate intense rehabilitation    Yes  Barriers to Discharge   none      Equipment Recommendations  Rolling walker with 5" wheels    Recommendations for Other Services   none  Frequency Min 2X/week   Progress towards PT Goals Progress towards PT goals: Progressing toward goals  Plan Current plan remains appropriate    Precautions / Restrictions Precautions Precautions: Fall Precaution Comments: right ankle is swollen and sore. Per wife, there are torn ligaments in both Required Braces or Orthoses: Other Brace/Splint (MD ordered ankle brace for right ankle) Other Brace/Splint: R ankle ASO Restrictions Weight Bearing Restrictions: No   Pertinent Vitals/Pain See vitals flow sheet.     Mobility  Bed Mobility Bed Mobility: Rolling Left;Rolling Right Rolling Right: 1: +1 Total assist Rolling Left: 1: +1 Total assist Supine to Sit: 3: Mod assist;With rails;HOB elevated Sitting - Scoot to Edge of Bed: 4: Min assist;With rail Details for Bed Mobility Assistance: mod assist to support trunk during transition.  Pt initiating movement nicely today.  Min assist to support trunk for balance while scooting to EOB using railing for leverage.  Transfers Transfers: Sit to Stand;Stand to Sit;Stand Pivot Transfers Sit to Stand: 3: Mod assist;With upper extremity assist;From bed;From elevated  surface Stand to Sit: 3: Mod assist;With upper extremity assist;With armrests;To chair/3-in-1 Stand Pivot Transfers: 3: Mod assist;From elevated surface Details for Transfer Assistance: mod assist to support trunk for balance as pt is self selecting to keep right foot TDWB due to pain in knee and ankle.  Stood from bed and again from chair (to place chair alarm).  Pivoted with RW today.   Ambulation/Gait Ambulation/Gait Assistance: 3: Mod assist Ambulation Distance (Feet): 5 Feet Assistive device: Rolling walker Ambulation/Gait Assistance Details: mod assist to support pt for balance.  Trunk leaning right despite painful right leg.  Ortho tech came in before gait attempted and applied ASO to right ankle for support.  Pt antalgic gait pattern, but was able to take a few steps with support of therapist and RW.   Gait Pattern: Step-to pattern;Antalgic;Lateral trunk lean to right      PT Goals (current goals can now be found in the care plan section) Acute Rehab PT Goals Patient Stated Goal: pt unable- wife would like pt to return to independent mobility  Visit Information  Last PT Received On: 09/11/12 Assistance Needed: +2 (for gait-chair to follow) History of Present Illness: 79 year old male found down at home in bathroom. PMH significant for dementia with declining memory x6-12 months, and poorly coherent speech x2 weeks.  MRI indicated small left frontal white matter lacunar infarct.  Pt also demonstrated seizure activity.    Subjective Data  Subjective: Pt's nefew is in the room today.  Pt is awake and alert, looking around the room, having difficulty expressing himself, but seems to understand what I  ask of him.   Patient Stated Goal: pt unable- wife would like pt to return to independent mobility   Cognition  Cognition Arousal/Alertness: Awake/alert Behavior During Therapy: Restless (a little restless, pulling at catheter and lines, gown ) Overall Cognitive Status:  Impaired/Different from baseline Area of Impairment: Attention;Memory;Following commands;Safety/judgement;Awareness;Problem solving Orientation Level: Disoriented to;Place;Person;Time;Situation Current Attention Level: Selective Memory: Decreased short-term memory Following Commands: Follows one step commands consistently Safety/Judgement: Decreased awareness of safety;Decreased awareness of deficits Awareness: Intellectual Problem Solving: Slow processing;Requires verbal cues;Requires tactile cues General Comments: Pt was very alert this PM, following simple commands and initiating all mobility.   Difficult to assess due to: Level of arousal    Balance  Static Sitting Balance Static Sitting - Balance Support: No upper extremity supported;Feet supported Static Sitting - Level of Assistance: 5: Stand by assistance Static Sitting - Comment/# of Minutes: supervision today, much improved over yesterday's session.    End of Session PT - End of Session Equipment Utilized During Treatment: Gait belt Activity Tolerance: Patient limited by pain Patient left: in chair;with call bell/phone within reach;with chair alarm set;with family/visitor present Nurse Communication: Mobility status;Need for lift equipment (to RN tech)     Rollene Rotunda. Charnae Lill, PT, DPT [phone]   09/11/2012, 4:37 PM

## 2012-09-11 NOTE — Progress Notes (Signed)
Stroke Team Progress Note  Dustin Roy is a 79 y.o. male who had been having declining memory over the last year and especially the last 6 months per wife. He had been seen as a out patient by his PCP for his declining memory and offered a trial of Aricept. His wife was not sure about starting this medication due to fear of some of the side effects. He does have known B12 deficiency which he received one injection for but, per wife, this did not have a large effect and his levels did not increase. 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 Over the past two weeks prior to admission his wife noted his speech had become less coherent (at times normal and other times making no sense). Over the past two days prior to admission she could not understand what he was saying. The patient was admitted to the hospital due to falling in his bathroom and having a contusion on his head. Initial CT head showed moderate atrophy and no blood. Neurology was asked to see patient for evaluation of dementia and initiating Aricept.   An MRI on 09/04/2012 showed a small acute white matter lacunar infarct in the left frontal lobe.  SUBJECTIVE His wife is at the bedside.  Wife reports he has been very awake this am - sleepy now after working with therapy this am.  OBJECTIVE Most recent Vital Signs: Filed Vitals:   09/10/12 1400 09/10/12 1716 09/10/12 2227 09/11/12 0202  BP: 147/52 148/66 112/82 154/69  Pulse: 90 84 97 91  Temp: 98.2 F (36.8 C) 97.4 F (36.3 C) 99 F (37.2 C)   TempSrc: Axillary Axillary Oral Oral  Resp: 18 18 18 20   Height:      Weight:      SpO2: 100% 98% 99% 100%   CBG (last 3)   Recent Labs  09/09/12 1019  GLUCAP 150*    IV Fluid Intake:   . dextrose 5 % and 0.9 % NaCl with KCl 20 mEq/L 100 mL/hr at 09/10/12 1040    MEDICATIONS  . aspirin  300 mg Rectal Daily  . enoxaparin (LOVENOX) injection  40 mg Subcutaneous Q24H  .  levothyroxine  25 mcg Intravenous Daily  . phenytoin (DILANTIN) IV  100 mg Intravenous Q8H   PRN:  RESOURCE THICKENUP CLEAR  Diet:  Dysphagia 1 nectar thick Activity:   Up with assistance DVT Prophylaxis:  Lovenox 40 mg sq daily   CLINICALLY SIGNIFICANT STUDIES Basic Metabolic Panel:   Recent Labs Lab 09/07/12 0545 09/08/12 1746 09/11/12 0500  NA 138 139  --   K 3.5 3.7  --   CL 104 105  --   CO2 28 27  --   GLUCOSE 129* 136*  --   BUN 19 12  --   CREATININE 0.94 0.94 0.80  CALCIUM 8.7 9.1  --    Liver Function Tests: No results found for this basename: AST, ALT, ALKPHOS, BILITOT, PROT, ALBUMIN,  in the last 168 hours CBC:   Recent Labs Lab 09/07/12 0545 09/08/12 1746  WBC 7.2 9.1  NEUTROABS  --  6.9  HGB 12.3* 13.0  HCT 35.5* 36.9*  MCV 99.2 99.5  PLT 199 211   Coagulation: No results found for this basename: LABPROT, INR,  in the last 168 hours Cardiac Enzymes: No results found for this basename: CKTOTAL, CKMB, CKMBINDEX, TROPONINI,  in the last 168 hours Urinalysis:  Recent Labs Lab 09/08/12 2302  COLORURINE AMBER*  LABSPEC 1.021  PHURINE 5.5  GLUCOSEU NEGATIVE  HGBUR NEGATIVE  BILIRUBINUR NEGATIVE  KETONESUR NEGATIVE  PROTEINUR NEGATIVE  UROBILINOGEN 1.0  NITRITE NEGATIVE  LEUKOCYTESUR NEGATIVE   Lipid Panel    Component Value Date/Time   CHOL 140 09/07/2012 0545   TRIG 101 09/07/2012 0545   HDL 50 09/07/2012 0545   CHOLHDL 2.8 09/07/2012 0545   VLDL 20 09/07/2012 0545   LDLCALC 70 09/07/2012 0545   HgbA1C  Lab Results  Component Value Date   HGBA1C 5.8* 09/07/2012    Urine Drug Screen:   No results found for this basename: labopia,  cocainscrnur,  labbenz,  amphetmu,  thcu,  labbarb    Alcohol Level: No results found for this basename: ETH,  in the last 168 hours  Mr Brain Wo Contrast  09/09/2012 matter infarction in the left frontal white matter inferior to the previously seen small cluster of infarctions. Those previously seen  infarctions have not changed in any significant fashion. No evidence of hemorrhage or mass effect. 09/04/2012 1. Small acute white matter lacunar infarct in the left frontal lobe. No mass effect or acute hemorrhage. 2. Mild for age signal changes elsewhere in the brain compatible with chronic small vessel disease.  CT of the brain Advanced atrophy and microvascular ischemic disease without acute intracranial process.  MRA of the brain - not performed  2D Echocardiogram EF 60%, LV fxn appears preserved.  Carotid Doppler Bilateral carotid artery duplex: Less than 39% ICA stenosis. Vertebral artery flow is antegrade.  CXR Mild left lower lobe atelectasis  CXR 09/08/2012 - No acute cardiopulmonary disease. Interval clearing of the left lung base.  EEG This is an abnormal EEG secondary to posterior background slowing. This finding may be seen with a diffuse gray matter disturbance that is etiologically nonspecific, but may include a dementia, among other possibilities.  EKG sinus rhythm left bundle branch block.  EEG CT was abnormal with mild to moderate generalized continuous nonspecific slowing of cerebral activity. Right knee xray  Moderate joint effusion. No definite acute fracture or dislocation at the right knee. Right ankle xray Lateral soft tissue swelling. No acute fracture or dislocation identified about the right ankle.  Therapy Recommendations CIR  Physical Exam  General: The patient is sleepy at the time of the evaluation. More alert than yesterday.  Skin: No significant peripheral edema is noted, with the exception that the right knee and ankle are swollen and painful.   Neurologic Exam  Cranial nerves: Facial symmetry is present.  He will blink to threat bilaterally. Non-verbal.  Motor: Poor motor effort of all 4's. Drift off both arms, pain with manipulation of the right leg.  Coordination: The patient would not cooperate for cerebellar testing.  Gait and station: The patient  could not be ambulated.  Reflexes: Deep tendon reflexes are symmetric.    ASSESSMENT Dustin Roy is a 79 y.o. male with dementia, presenting with fall in bathroom and confusion. Initial scan showed small left lacunar infarct in frontal lobe. Patient has since had an episode of where he was arching his arms and was non responsive in the room, witnessed by wife and staff; felt to be a seizure. On aspirin 81 mg orally every day prior to admission. Now on aspirin suppository for secondary stroke prevention. Stroke workup complete.   Extreme sedation most likely related to keppra. It is the most common side effect of keppra. Even low doses in elderly  patients, especially those with dementia, can lead to significant lethargy. Changed keppra to dilantin 6/30 - sedation is resolving enough to eat - keppra should be out of his system by tomororw Right ankle red, warm to touch. No fx per xray - recommend ankle brace. Right ankle and knee pain due to trauma from fall at stroke onset. No hx gout per wife. Hypertension  Hyperlipidemia, LDL 70 at goal.  Long term medication use  Dementia   seizures - none reported since Friday . Continue n.p.o. status per speech therapy  Fever resolved - chest x-ray improved.  Hospital day # 9  TREATMENT/PLAN  Change to  clopidogrel 75 mg orally every day for secondary stroke prevention.   right ankle brace  Change dilantin to 300 mg q hs  Consider CIR at discharge  Annie Main, MSN, RN, ANVP-BC, ANP-BC, GNP-BC [facility] Pager: [phone] 09/11/2012 10:21 AM  I have personally obtained a Dustin, examined the patient, evaluated imaging results, and formulated the assessment and plan of care. I agree with the above.  Lesly Dukes

## 2012-09-12 ENCOUNTER — Encounter (HOSPITAL_COMMUNITY): Payer: Self-pay | Admitting: Physical Medicine and Rehabilitation

## 2012-09-12 DIAGNOSIS — I6330 Cerebral infarction due to thrombosis of unspecified cerebral artery: Secondary | ICD-10-CM

## 2012-09-12 MED ORDER — HYDROCORTISONE 1 % EX CREA: TOPICAL_CREAM | Freq: Three times a day (TID) | CUTANEOUS | Status: DC

## 2012-09-12 MED ORDER — PHENYTOIN SODIUM EXTENDED 300 MG PO CAPS: 300.0000 mg | ORAL_CAPSULE | Freq: Every day | ORAL | Status: DC

## 2012-09-12 MED ORDER — CLOPIDOGREL BISULFATE 75 MG PO TABS: 75.0000 mg | ORAL_TABLET | Freq: Every day | ORAL | Status: AC

## 2012-09-12 MED ORDER — ATORVASTATIN CALCIUM 10 MG PO TABS: 10.0000 mg | ORAL_TABLET | Freq: Every day | ORAL | Status: AC

## 2012-09-12 MED ORDER — HYDROCORTISONE 1 % EX CREA
TOPICAL_CREAM | Freq: Three times a day (TID) | CUTANEOUS | Status: DC
  Administered 2012-09-12 – 2012-09-13 (×4): via TOPICAL
  Filled 2012-09-12: qty 28

## 2012-09-12 NOTE — Discharge Summary (Addendum)
Physician Discharge Summary  Dustin Roy UJW:[id number] DOB: 04-Nov-1920 DOA: 09/02/2012  PCP: No primary provider on file.  Admit date: 09/02/2012 Discharge date: 09/12/2012  Time spent: 35 minutes  Recommendations for Outpatient Follow-up:  1. Patient being placed on a dysphagia one nectar thick liquid diet 2. He will be started on Plavix 3. Patient going to skilled nursing facility  Discharge Diagnoses:  Active Problems:   Fall at home: Patient stained suspected concussion syndrome from mechanical fall. Further workup noted fall likely from stroke. See below.   HTN (hypertension): Stable.    Hypothyroidism: TSH checked. Continue Synthroid.    CVA (cerebral infarction): Neurology initially consulted for assistance in workup of dementia. MRI revealed acute infarct in left frontal lobe.patient was started on aspirin 81 mg daily. See below for seizure evaluation.  Neurology also add Plavix 75 mg daily.     Seizure: On morning of 6/25, patient found to have arms jerking and stiff. Vital signs remained stable. Looked to be a generalized tonic-clonic seizure which lasted approximately 3 minutes. Neurology notified and patient started on IV Keppra. This prevented any further seizures, however led to excess sedation. Patient's Keppra was changed to Dilantin. Since that time, patient has been more interactive and alert. Still confused underlying dementia.    Dementia: Stable. Patient not felt to be candidate for inpatient rehabilitation given dementia and difficulty following commands.  Grade 1 diastolic dysfunction: Incidentally noted on echocardiogram. No signs of any volume overload.   Discharge Condition: Improved from initial presentation, being discharged to skilled nursing facility  Diet recommendation: Dysphasia 1 with nectar thick liquids  Filed Weights   09/03/12 0206  Weight: 61.8 kg (136 lb 3.9 oz)    History of present illness:  On 6/23:Dustin Roy is an 79 y.o.  male with hx of dementia with baseline confusion, HTN, hypothyroidism, lives at home with his wife, found down in his bathroom. He reportedly hit his head. No seizure activity reported. There has been no complaint of HA, chest pain, nausea, vomiting, palpitation or lightheadedness. Evaluation in the ER included negative head CT and unremarkable serology. No EKG done. Wife stated that he was a little more confused than usual, so hospitalist was asked to admit him for OBS, likely due to post concussion syndrome.   Hospital Course:  Active Problems:   Fall at home   HTN (hypertension)   Hypothyroidism   CVA (cerebral infarction)   Seizure   Dementia   Skin tear of upper arm without complication   Fever, unspecified   Procedures:  Echocardiogram done 6/26: Grade 1 diastolic dysfunction, normal preserved ejection fraction  Carotid Dopplers done 6/26: No significant carotid artery stenosis bilaterally.  Consultations:  Neurology  Discharge Exam: Filed Vitals:   09/11/12 2121 09/12/12 0200 09/12/12 0600 09/12/12 0940  BP: 157/77 134/53 161/87 121/82  Pulse: 97 86 91 83  Temp: 98.2 F (36.8 C) 98.4 F (36.9 C) 97.6 F (36.4 C) 98.2 F (36.8 C)  TempSrc:  Axillary  Oral  Resp: 16 16 16 20   Height:      Weight:      SpO2: 100% 98% 100% 99%    General: Some lethargy, in no acute distress, intermittently follows commands Cardiovascular: Regular rate and rhythm, S1-S2 Respiratory: Clear to auscultation bilaterally Abdomen: Soft, nontender, nondistended, positive bowel sounds  Discharge Instructions  Discharge Orders   Future Orders Complete By Expires     DIET - DYS 1  As directed     Comments:  Nectar thick liquids    Increase activity slowly  As directed         Medication List         aspirin EC 81 MG tablet  Take 81 mg by mouth daily.     clopidogrel 75 MG tablet  Commonly known as:  PLAVIX  Take 1 tablet (75 mg total) by mouth daily with breakfast.      hydrocortisone cream 1 %  Apply topically 3 (three) times daily. X 2 days     levothyroxine 50 MCG tablet  Commonly known as:  SYNTHROID, LEVOTHROID  Take 50 mcg by mouth daily before breakfast.     phenytoin 300 MG ER capsule  Commonly known as:  DILANTIN  Take 1 capsule (300 mg total) by mouth at bedtime.     triamterene-hydrochlorothiazide 75-50 MG per tablet  Commonly known as:  MAXZIDE  Take 0.5 tablets by mouth daily.    Atorvastatin 10 mg tablet Commonly known as: Lipitor Take 1 tablet by mouth nightly    Allergies  Allergen Reactions  . Amoxicillin Other (See Comments)    Hallucinations; fever       Follow-up Information   Follow up with Gates Rigg, MD. Schedule an appointment as soon as possible for a visit in 2 months. (stroke clinc)    Contact information:   [address] [place] [address] [phone]        The results of significant diagnostics from this hospitalization (including imaging, microbiology, ancillary and laboratory) are listed below for reference.    Significant Diagnostic Studies: Dg Knee 1-2 Views Right  09/10/2012  IMPRESSION: Moderate joint effusion.  No definite acute fracture or dislocation at the right knee.   Original Report Authenticated By: Erskine Speed, M.D.   Dg Ankle 2 Views Right  09/10/2012   IMPRESSION: Lateral soft tissue swelling. No acute fracture or dislocation identified about the right ankle.   Original Report Authenticated By: Erskine Speed, M.D.   Ct Head Wo Contrast  09/05/2012    IMPRESSION: Advanced atrophy and microvascular ischemic disease without acute intracranial process.     Ct Cervical Spine Wo Contrast  09/02/2012   IMPRESSION: Negative for fracture.   Original Report Authenticated By: Janeece Riggers, M.D.   Mr Brain Wo Contrast  09/09/2012  IMPRESSION: Since the study of 5 days ago, there is a single new punctate white matter infarction in the left frontal white matter inferior to  the previously seen small cluster of infarctions.  Those previously seen infarctions have not changed in any significant fashion.  No evidence of hemorrhage or mass effect.   Original Report Authenticated By: Paulina Fusi, M.D.   Mr Brain Wo Contrast  09/04/2012   IMPRESSION: 1.  Small acute white matter lacunar infarct in the left  frontal lobe.  No mass effect or acute hemorrhage. 2.  Mild for age signal changes elsewhere in the brain compatible with chronic small vessel disease.   Original Report Authenticated By: Erskine Speed, M.D.   Dg Chest Port 1 View  09/08/2012     IMPRESSION:  1.  No acute cardiopulmonary disease. 2.  Interval clearing of the left lung base.   Original Report Authenticated By: Marin Roberts, M.D.   Dg Chest Port 1 View  09/02/2012    IMPRESSION: Mild left lower lobe atelectasis   Original Report Authenticated By: Janeece Riggers, M.D.    Microbiology: Recent Results (from the past 240 hour(s))  URINE CULTURE  Status: None   Collection Time    09/04/12  1:54 PM      Result Value Range Status   Specimen Description URINE, CLEAN CATCH   Final   Special Requests NONE   Final   Culture  Setup Time 09/04/2012 23:47   Final   Colony Count NO GROWTH   Final   Culture NO GROWTH   Final   Report Status 09/05/2012 FINAL   Final     Labs: Basic Metabolic Panel:  Recent Labs Lab 09/07/12 0545 09/08/12 1746 09/11/12 0500  NA 138 139  --   K 3.5 3.7  --   CL 104 105  --   CO2 28 27  --   GLUCOSE 129* 136*  --   BUN 19 12  --   CREATININE 0.94 0.94 0.80  CALCIUM 8.7 9.1  --     Recent Labs Lab 09/08/12 2120  AMMONIA 17   CBC:  Recent Labs Lab 09/07/12 0545 09/08/12 1746  WBC 7.2 9.1  NEUTROABS  --  6.9  HGB 12.3* 13.0  HCT 35.5* 36.9*  MCV 99.2 99.5  PLT 199 211   CBG:  Recent Labs Lab 09/06/12 1103 09/09/12 1019  GLUCAP 155* 150*       Signed:  Jakya Dovidio K  [facility] 09/12/2012, 2:25 PM

## 2012-09-12 NOTE — Progress Notes (Signed)
Patient unable to participate adequately for inpt rehab at this time. SNF recommended. I will follow in case mental status improves in the short term. Noted changes with keppra to dilantin earlier during hospitalization. [phone]

## 2012-09-12 NOTE — Progress Notes (Signed)
Stroke Team Progress Note  HISTORY Dustin Roy is a 79 y.o. male who had been having declining memory over the last year and especially the last 6 months per wife. He had been seen as a out patient by his PCP for his declining memory and offered a trial of Aricept. His wife was not sure about starting this medication due to fear of some of the side effects. He does have known B12 deficiency which he received one injection for but, per wife, this did not have a large effect and his levels did not increase. 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 Over the past two weeks prior to admission his wife noted his speech had become less coherent (at times normal and other times making no sense). Over the past two days prior to admission she could not understand what he was saying. The patient was admitted to the hospital due to falling in his bathroom and having a contusion on his head. Initial CT head showed moderate atrophy and no blood. Neurology was asked to see patient for evaluation of dementia and initiating Aricept.   An MRI on 09/04/2012 showed a small acute white matter lacunar infarct in the left frontal lobe.  SUBJECTIVE His wife is at the bedside.  Wife reports he continues to be awake, just agitated. New rash on his back.  OBJECTIVE Most recent Vital Signs: Filed Vitals:   09/11/12 2121 09/12/12 0200 09/12/12 0600 09/12/12 0940  BP: 157/77 134/53 161/87 121/82  Pulse: 97 86 91 83  Temp: 98.2 F (36.8 C) 98.4 F (36.9 C) 97.6 F (36.4 C) 98.2 F (36.8 C)  TempSrc:  Axillary  Oral  Resp: 16 16 16 20   Height:      Weight:      SpO2: 100% 98% 100% 99%   CBG (last 3)  No results found for this basename: GLUCAP,  in the last 72 hours  IV Fluid Intake:      MEDICATIONS  . clopidogrel  75 mg Oral Q breakfast  . enoxaparin (LOVENOX) injection  40 mg Subcutaneous Q24H  . hydrocortisone cream   Topical TID  . levothyroxine  50 mcg  Oral QAC breakfast  . phenytoin  300 mg Oral QHS   PRN:  acetaminophen, haloperidol lactate, RESOURCE THICKENUP CLEAR  Diet:  Dysphagia 1 nectar thick Activity:   Up with assistance DVT Prophylaxis:  Lovenox 40 mg sq daily   CLINICALLY SIGNIFICANT STUDIES Basic Metabolic Panel:   Recent Labs Lab 09/07/12 0545 09/08/12 1746 09/11/12 0500  NA 138 139  --   K 3.5 3.7  --   CL 104 105  --   CO2 28 27  --   GLUCOSE 129* 136*  --   BUN 19 12  --   CREATININE 0.94 0.94 0.80  CALCIUM 8.7 9.1  --    Liver Function Tests: No results found for this basename: AST, ALT, ALKPHOS, BILITOT, PROT, ALBUMIN,  in the last 168 hours CBC:   Recent Labs Lab 09/07/12 0545 09/08/12 1746  WBC 7.2 9.1  NEUTROABS  --  6.9  HGB 12.3* 13.0  HCT 35.5* 36.9*  MCV 99.2 99.5  PLT 199 211   Coagulation: No results found for this basename: LABPROT, INR,  in the last 168 hours Cardiac Enzymes: No results found for this basename: CKTOTAL, CKMB, CKMBINDEX, TROPONINI,  in the last 168 hours Urinalysis:   Recent Labs Lab 09/08/12  2302  COLORURINE AMBER*  LABSPEC 1.021  PHURINE 5.5  GLUCOSEU NEGATIVE  HGBUR NEGATIVE  BILIRUBINUR NEGATIVE  KETONESUR NEGATIVE  PROTEINUR NEGATIVE  UROBILINOGEN 1.0  NITRITE NEGATIVE  LEUKOCYTESUR NEGATIVE   Lipid Panel    Component Value Date/Time   CHOL 140 09/07/2012 0545   TRIG 101 09/07/2012 0545   HDL 50 09/07/2012 0545   CHOLHDL 2.8 09/07/2012 0545   VLDL 20 09/07/2012 0545   LDLCALC 70 09/07/2012 0545   HgbA1C  Lab Results  Component Value Date   HGBA1C 5.8* 09/07/2012    Urine Drug Screen:   No results found for this basename: labopia,  cocainscrnur,  labbenz,  amphetmu,  thcu,  labbarb    Alcohol Level: No results found for this basename: ETH,  in the last 168 hours  Mr Brain Wo Contrast  09/09/2012 matter infarction in the left frontal white matter inferior to the previously seen small cluster of infarctions. Those previously seen infarctions have  not changed in any significant fashion. No evidence of hemorrhage or mass effect. 09/04/2012 1. Small acute white matter lacunar infarct in the left frontal lobe. No mass effect or acute hemorrhage. 2. Mild for age signal changes elsewhere in the brain compatible with chronic small vessel disease.  CT of the brain Advanced atrophy and microvascular ischemic disease without acute intracranial process.  MRA of the brain - not performed  2D Echocardiogram EF 60%, LV fxn appears preserved.  Carotid Doppler Bilateral carotid artery duplex: Less than 39% ICA stenosis. Vertebral artery flow is antegrade.  CXR Mild left lower lobe atelectasis  CXR 09/08/2012 - No acute cardiopulmonary disease. Interval clearing of the left lung base.  EEG This is an abnormal EEG secondary to posterior background slowing. This finding may be seen with a diffuse gray matter disturbance that is etiologically nonspecific, but may include a dementia, among other possibilities.  EKG sinus rhythm left bundle branch block.  EEG CT was abnormal with mild to moderate generalized continuous nonspecific slowing of cerebral activity. Right knee xray  Moderate joint effusion. No definite acute fracture or dislocation at the right knee. Right ankle xray Lateral soft tissue swelling. No acute fracture or dislocation identified about the right ankle.  Therapy Recommendations CIR  Physical Exam General: The patient is  more alert than yesterday. Skin: No significant peripheral edema is noted, with the exception that the right knee and ankle are swollen and painful. Neurologic Exam Cranial nerves: Facial symmetry is present.  He will blink to threat bilaterally. Non-verbal. Motor: Poor motor effort of all 4's. Drift off both arms, pain with manipulation of the right leg. Coordination: The patient would not cooperate for cerebellar testing. Gait and station: The patient could not be ambulated. Reflexes: Deep tendon reflexes are  symmetric.   ASSESSMENT Dustin Roy is a 79 y.o. male with dementia, presenting with fall in bathroom and confusion. Initial scan showed small left lacunar infarct in frontal lobe. Patient has since had an episode of where he was arching his arms and was non responsive in the room, witnessed by wife and staff; felt to be a seizure. On aspirin 81 mg orally every day prior to admission. Now on aspirin suppository for secondary stroke prevention. Stroke workup complete.   Extreme sedation, resolving. Related to keppra. Changed keppra to dilantin 6/30 - sedation is resolving. Now with new rash after starting dilantin Right ankle red, warm to touch. No fx per xray - recommend ankle brace. Right ankle and knee pain  due to trauma from fall at stroke onset. No hx gout per wife. right ankle brace on. No pain with movement today. Hypertension  Hyperlipidemia, LDL 70 at goal.  Long term medication use  Dementia   seizures - none reported since Friday .  Fever resolved - chest x-ray improved.  Hospital day # 10  TREATMENT/PLAN  Continue  clopidogrel 75 mg orally every day for secondary stroke prevention.   Monitor rash on dilantin. If worse tomorrow, may consider changing Dilantin  CIR at discharge  D/w family at bedside  [facility], MSN, RN, ANVP-BC, ANP-BC, GNP-BC [facility] Pager: [phone] 09/12/2012 10:46 AM  I have personally obtained a history, examined the patient, evaluated imaging results, and formulated the assessment and plan of care. I agree with the above.  Delia Heady, MD

## 2012-09-12 NOTE — Progress Notes (Addendum)
Rehab Admissions Coordinator Note:  Patient was screened by Clois Dupes for appropriateness for an Inpatient Acute Rehab Consult.  At this time, we are recommending Inpatient Rehab consult for consideration. I will contact Attending MD.  Clois Dupes 09/12/2012, 8:15 AM  I can be reached at [phone].

## 2012-09-12 NOTE — Progress Notes (Signed)
Speech Language Pathology Dysphagia Treatment Patient Details Name: Dustin Roy MRN: [id number] DOB: [birth year] Today's Date: 09/12/2012 Time: 0810-0827 SLP Time Calculation (min): 17 min  Assessment / Plan / Recommendation Clinical Impression  Pts lethargy persists, wife reports pt restless through the night, did not sleep well. SLP provided total assist fading to max assist for self feeding with minimal evidence of oral/oropharyngeal residual (delayed throat clear). Pt able to complete second swallow on commands to reduce residual. Pt is tolerating current diet, will not attempt to upgrade until arousal improves. Pts verbal expression dominated by perseveration and jargon. Pt unable to repeat single words or name with max cues. Not responding to basic Y/N questions today in contrast with yesterday. Attention briefly and intermittently focused with max contextual/verbal cues. Will continue to follow     Diet Recommendation  Continue with Current Diet: Dysphagia 1 (puree);Nectar-thick liquid    SLP Plan Continue with current plan of care   Pertinent Vitals/Pain NA   Swallowing Goals  SLP Swallowing Goals Patient will consume recommended diet without observed clinical signs of aspiration with: Total assistance Swallow Study Goal #1 - Progress: Progressing toward goal Patient will utilize recommended strategies during swallow to increase swallowing safety with: Minimal cueing Swallow Study Goal #2 - Progress: Progressing toward goal Goal #3: Patient will maintain LOA to consume diagnostic PO trials to determine PO readiness.  Swallow Study Goal #3 - Progress: Met  General Temperature Spikes Noted: No Respiratory Status: Room air Behavior/Cognition: Lethargic;Cooperative;Requires cueing;Distractible;Decreased sustained attention Oral Cavity - Dentition: Adequate natural dentition Patient Positioning: Upright in bed  Oral Cavity - Oral Hygiene Does patient have any of the following  "at risk" factors?: Other - dysphagia;Diet - patient on thickened liquids;Nutritional status - dependent feeder;Saliva - thick, dry mouth Patient is AT RISK - Oral Care Protocol followed (see row info): Yes   Dysphagia Treatment Treatment focused on: Patient/family/caregiver education;Skilled observation of diet tolerance;Upgraded PO texture trials;Utilization of compensatory strategies Family/Caregiver Educated: Spouse Treatment Methods/Modalities: Differential diagnosis;Skilled observation Patient observed directly with PO's: Yes Type of PO's observed: Nectar-thick liquids;Dysphagia 1 (puree) Reason PO's not observed: Lethargic Feeding: Total assist;Needs assist;Able to feed self Liquids provided via: Cup;Straw Oral Phase Signs & Symptoms: Prolonged oral phase;Prolonged bolus formation Pharyngeal Phase Signs & Symptoms: Suspected delayed swallow initiation;Delayed throat clear Type of cueing: Verbal;Tactile;Visual Amount of cueing: Moderate   GO    Harlon Ditty, [place] CCC-SLP [phone]  Claudine Mouton 09/12/2012, 9:33 AM

## 2012-09-12 NOTE — Clinical Social Work Note (Signed)
CSW continues to follow pt for possibility of SNF placement.  Pt is also being considered for CIR.    Vickii Penna, LCSWA [phone]  Clinical Social Work

## 2012-09-12 NOTE — Progress Notes (Signed)
[facility] oncall notified of rash that has been on patient's back for 3 days (per wife). Possible dilantin rash? Topical cream ordered. Will cont to monitor.

## 2012-09-12 NOTE — Progress Notes (Signed)
Physical Therapy Treatment Patient Details Name: Dustin Roy MRN: [id number] DOB: 1920/05/03 Today's Date: 09/12/2012 Time: 1478-2956 PT Time Calculation (min): 24 min  PT Assessment / Plan / Recommendation  PT Comments   Pt making good gains. Still would be a great candidate for short term rehab.   Follow Up Recommendations  CIR     Does the patient have the potential to tolerate intense rehabilitation     Barriers to Discharge        Equipment Recommendations  Rolling walker with 5" wheels    Recommendations for Other Services    Frequency Min 3X/week   Progress towards PT Goals Progress towards PT goals: Progressing toward goals  Plan Current plan remains appropriate    Precautions / Restrictions Precautions Precautions: Fall Precaution Comments: R ankle sore, but less swollen and less of a hindrance ambulating Required Braces or Orthoses: Other Brace/Splint Other Brace/Splint: R ankle ASO   Pertinent Vitals/Pain     Mobility  Bed Mobility Bed Mobility: Supine to Sit;Sitting - Scoot to Edge of Bed Supine to Sit: 4: Min assist;With rails Sitting - Scoot to [place] of Bed: 4: Min assist Details for Bed Mobility Assistance: tc's and minimal assist to guide Transfers Transfers: Sit to Stand;Stand to Sit Sit to Stand: 4: Min assist;With upper extremity assist;From bed Stand to Sit: 4: Min assist;With upper extremity assist;To chair/3-in-1 Details for Transfer Assistance: steadying assist Ambulation/Gait Ambulation/Gait Assistance: 4: Min assist Ambulation Distance (Feet): 140 Feet Assistive device: Rolling walker Ambulation/Gait Assistance Details: minimal assist at hips to control for stability; steps variable .  step length generally equal, but scissoring in nature. Gait Pattern: Step-through pattern;Scissoring;Narrow base of support (less antalgic) Gait velocity: decreased Stairs: No Modified Rankin (Stroke Patients Only) Pre-Morbid Rankin Score: No  symptoms Modified Rankin: Moderately severe disability    Exercises     PT Diagnosis:    PT Problem List:   PT Treatment Interventions:     PT Goals (current goals can now be found in the care plan section) Acute Rehab PT Goals PT Goal Formulation: With patient/family Time For Goal Achievement: 09/20/12 Potential to Achieve Goals: Fair  Visit Information  Last PT Received On: 09/12/12 Assistance Needed: +1 History of Present Illness: 79 year old male found down at home in bathroom. PMH significant for dementia with declining memory x6-12 months, and poorly coherent speech x2 weeks.  MRI indicated small left frontal white matter lacunar infarct.  Pt also demonstrated seizure activity.    Subjective Data      Cognition  Cognition Arousal/Alertness: Awake/alert Behavior During Therapy: Restless Overall Cognitive Status: Impaired/Different from baseline Area of Impairment: Attention;Memory;Following commands;Safety/judgement;Awareness;Problem solving Orientation Level: Place;Time;Situation;Disoriented to Current Attention Level: Selective Memory: Decreased short-term memory Following Commands: Follows one step commands consistently;Follows one step commands with increased time Safety/Judgement: Decreased awareness of safety;Decreased awareness of deficits Problem Solving: Slow processing;Requires verbal cues;Requires tactile cues General Comments: Pt was very alert this PM, following simple commands and initiating all mobility.      Balance  Balance Balance Assessed: Yes Static Standing Balance Static Standing - Balance Support: Bilateral upper extremity supported Static Standing - Level of Assistance: 4: Min assist Static Standing - Comment/# of Minutes: 1-2 min with minimal assist while discussing PLOF  End of Session PT - End of Session Activity Tolerance: Patient tolerated treatment well Patient left: in chair;with call bell/phone within reach;with family/visitor  present Nurse Communication: Mobility status   GP     Briseis Aguilera, Eliseo Gum 09/12/2012, 5:07  PM 09/12/2012  Cameron Bing, PT [phone]  (pager)

## 2012-09-12 NOTE — Clinical Social Work Note (Signed)
Facility and CSW unable to reach family (left multiple messages) to complete paperwork for SNF.  D/c packet complete.  If pt medically stable, pt to d/c to [facility], SNF tomorrow (Thursday).    Vickii Penna, LCSWA [phone]  Clinical Social Work

## 2012-09-12 NOTE — Clinical Social Work Note (Addendum)
3:10pm- CSW sent d/c summary and AVS electronically to [facility] for admissions to review.    CSW was notified by CIR that pt was rec for SNF.  CSW contacted [facility] to confirm bed availability and possibility of d/c today.  CSW left message and is awaiting a call back.  CSW called and l/m for son, Jeffre Enriques [phone] re: disposition plans.  Awaiting a return call.    CSW requested MD initiate d/c summary if appropriate.     Vickii Penna, LCSWA [phone]  Clinical Social Work

## 2012-09-12 NOTE — Consult Note (Signed)
Physical Medicine and Rehabilitation Consult  Reason for Consult: Worsening confusion, seizure Referring Physician:  Dr. Rito Ehrlich.   HPI: Dustin Roy is a 79 y.o. male with hx of dementia with declining worsening of baseline confusion and speech for 2 weeks PTA, HTN, B 12 deficiency, hypothyroidism; who was found down in bathroom by wife on 09/04/12. MRI brain with small acute lacunar infarct in left frontal lobe. Patient with seizure activity past admission and Keppra initiated. EEG with mild to moderate nonspecific slowing--metabolic v/s degenerative nervous system disorder (including progressive dementia). Follow up MRI brain  on 06/29 with single new punctate white matter infarct in left frontal white matter and was changed to plavix for secondary stroke prevention. Due to excessive sedation keppra was changed to Dilantin. He was found to have right knee contusion and right ankle sprain. ASO added for ankle support.  As mentation improved, he was started on D1 diet with nectar liquids yesterday. Therapies initiated and he has had some improvement in mentation and participation. PT, ST, MD recommending CIR.  According to patient's wife, patient was oriented to self at home but needed cueing for place. Was not oriented to time at home Injured right ankle with fall during this infarct. X-rays negative Review of Systems  HENT: Negative for hearing loss.   Respiratory: Negative for cough, hemoptysis and shortness of breath.   Cardiovascular: Negative for chest pain.  Gastrointestinal: Positive for constipation. Negative for heartburn and nausea.  Genitourinary: Positive for frequency (nocturia).  Musculoskeletal: Positive for falls ("but never hurt himself").  Neurological: Positive for speech change.  Psychiatric/Behavioral: The patient has insomnia (since admission).    Past Medical History  Diagnosis Date  . Hypertension   . Hypothyroid   . Macular degeneration     left eye?   Past  Surgical History  Procedure Laterality Date  . Tonsillectomy    . Nasal polyp excision      Family History  Problem Relation Age of Onset  . Cancer Mother     colon  . Aortic aneurysm Father   . Diabetes Sister   . Cancer - Other Sister     brain    Social History: Married. Independent with cane PTA. Still drives short distances. 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 Per  reports that he has never smoked. He does not have any smokeless tobacco history on file. His alcohol and drug histories are not on file.   Allergies  Allergen Reactions  . Amoxicillin Other (See Comments)    Hallucinations; fever   Medications Prior to Admission  Medication Sig Dispense Refill  . aspirin EC 81 MG tablet Take 81 mg by mouth daily.      Marland Kitchen levothyroxine (SYNTHROID, LEVOTHROID) 50 MCG tablet Take 50 mcg by mouth daily before breakfast.      . triamterene-hydrochlorothiazide (MAXZIDE) 75-50 MG per tablet Take 0.5 tablets by mouth daily.        Home: Home Living Family/patient expects to be discharged to:: Private residence Living Arrangements: Spouse/significant other Available Help at Discharge: Family;Available 24 hours/day Type of Home: House Home Access: Stairs to enter [employer] of Steps: 2 Entrance Stairs-Rails: Can reach both;Right;Left Home Layout: Two level;Able to live on main level with bedroom/bathroom Alternate Level Stairs-Number of Steps: 14 Alternate Level Stairs-Rails: Right Home Equipment: None Additional Comments: cane was mother in law's   Lives With: Spouse  Functional History: Prior Function Vocation: Retired Comments: took  care of his own bathing and dressing. Functional Status:  Mobility: Bed Mobility Bed Mobility: Not assessed Rolling Right: 1: +1 Total assist Rolling Left: 1: +1 Total assist Supine to Sit: 3: Mod assist;With rails;HOB elevated Sitting - Scoot to Edge of Bed:  4: Min assist;With rail Transfers Transfers: Sit to Stand;Stand to [employer] to Stand: 3: Mod assist;With upper extremity assist;From bed;From elevated surface Stand to Sit: 3: Mod assist;With upper extremity assist;With armrests;To chair/3-in-1 Stand Pivot Transfers: 3: Mod assist;From elevated surface Squat Pivot Transfers: 2: Max assist;From elevated surface Ambulation/Gait Ambulation/Gait Assistance: 4: Min assist Ambulation Distance (Feet): 25 Feet Assistive device: Rolling walker Ambulation/Gait Assistance Details: cues for tall posture and safe positioning of feet, following simple commands well, followed by nephew with chair, distracted by trying to find his wife (perseverating on [place]), stability assist throughout gait Gait Pattern: Antalgic;Lateral trunk lean to left;Decreased stride length;Decreased weight shift to right;Trunk flexed Gait velocity: decreased Stairs: No    ADL: ADL Eating/Feeding: Minimal assistance Where Assessed - Eating/Feeding: Other (comment) (supine, HOB elevated) Grooming: Wash/dry face;Wash/dry hands;Minimal assistance Where Assessed - Grooming: Supine, head of bed up Upper Body Bathing: Moderate assistance Where Assessed - Upper Body Bathing: Supine, head of bed up Lower Body Bathing: +1 Total assistance Where Assessed - Lower Body Bathing: Supine, head of bed up Upper Body Dressing: +1 Total assistance Where Assessed - Upper Body Dressing: Supine, head of bed up Lower Body Dressing: +1 Total assistance Where Assessed - Lower Body Dressing: Supine, head of bed up Toilet Transfer: Moderate assistance Toilet Transfer Method: Sit to stand Toilet Transfer Equipment: Comfort height toilet Equipment Used: Gait belt Transfers/Ambulation Related to ADLs: No transfers attempted ADL Comments: When OTS entered the room, the Pt had wet himself, and was a total assist for bed mobilization, and cleaning LB. Pt was able to use washcloth to clean face, arms,  chest, private and hands. Pt able to feed himself from supine in bed with HOB elevated.  Pt also successful in drinking from cup. Pt with empty mouth and clear voice after swallowing. Pt needed assistance sometimes with keeping food on his spoon.  Cognition: Cognition Overall Cognitive Status: Impaired/Different from baseline Arousal/Alertness: Lethargic Orientation Level: Oriented to person;Disoriented to place;Disoriented to time;Disoriented to situation Attention: Focused;Sustained Focused Attention: Impaired Focused Attention Impairment: Verbal basic;Functional basic (intermittent) Sustained Attention: Impaired Sustained Attention Impairment: Verbal basic;Functional basic (30 seconds) Memory: Impaired Memory Impairment: Storage deficit;Retrieval deficit;Decreased recall of new information;Decreased short term memory Decreased Short Term Memory: Verbal basic;Functional basic Awareness: Impaired Awareness Impairment: Intellectual impairment;Emergent impairment Problem Solving: Impaired Problem Solving Impairment: Verbal basic;Functional basic Behaviors: Restless Safety/Judgment: Impaired Cognition Arousal/Alertness: Awake/alert Behavior During Therapy: Restless Overall Cognitive Status: Impaired/Different from baseline Area of Impairment: Attention;Memory;Following commands;Safety/judgement;Awareness;Problem solving Orientation Level: Disoriented to;Place;Person;Time;Situation Current Attention Level: Selective Memory: Decreased short-term memory Following Commands: Follows one step commands consistently Safety/Judgement: Decreased awareness of safety;Decreased awareness of deficits Awareness: Intellectual Problem Solving: Slow processing;Requires verbal cues;Requires tactile cues General Comments: Pt was very alert this PM, following simple commands and initiating all mobility.   Difficult to assess due to: Level of arousal  Blood pressure 121/82, pulse 83, temperature 98.2 F  (36.8 C), temperature source Oral, resp. rate 20, height 5\' 9"  (1.753 m), weight 61.8 kg (136 lb 3.9 oz), SpO2 99.00%. Physical Exam  Nursing note and vitals reviewed. Constitutional: He appears well-developed and well-nourished.  HENT:  Head: Normocephalic.  Eyes: Conjunctivae are normal. Pupils are equal, round, and reactive to light.  Neck: Normal  range of motion. Neck supple.  Cardiovascular: Normal rate.   No murmur heard. Pulmonary/Chest: Effort normal and breath sounds normal.  Abdominal: Soft. Bowel sounds are normal. He exhibits no distension. There is no tenderness.  Neurological: He is alert.  Oriented to self only. Occasional word salad but was able to answer some biographic information once more alert. Distracted with perseverative behaviors. Able to follow simple one step commands with visual cues.   Skin: Skin is warm and dry.  Multiple bruises on BUE.    motor strength 4/5 right deltoid, bicep, tricep, grip, hip flexors, knee extension, ankle dorsiflexor plantar flexor 5/5 on left side Sensation could not assess secondary to mental status  No results found for this or any previous visit (from the past 24 hour(s)). Dg Knee 1-2 Views Right  09/10/2012   *RADIOLOGY REPORT*  Clinical Data: 79 year old male status post fall with pain and swelling limited range of motion.  RIGHT KNEE - 1-2 VIEW  Comparison: None.  Findings: Moderate suprapatellar joint effusion.  No light of hemarthrosis evident on the cross-table lateral view.  Patella appears intact.  No definite acute fracture or dislocation of the right knee.  Calcified atherosclerosis.  IMPRESSION: Moderate joint effusion.  No definite acute fracture or dislocation at the right knee.   Original Report Authenticated By: Erskine Speed, M.D.   Dg Ankle 2 Views Right  09/10/2012   *RADIOLOGY REPORT*  Clinical Data: 79 year old male status post fall with pain and swelling and limited range of motion.  RIGHT ANKLE - 2 VIEW   Comparison: None.  Findings: Soft tissue swelling about the lateral ankle.  No joint effusion is evident.  Calcaneus intact.  Mortise joint alignment preserved.  No acute fracture identified.  IMPRESSION: Lateral soft tissue swelling. No acute fracture or dislocation identified about the right ankle.   Original Report Authenticated By: Erskine Speed, M.D.    Assessment/Plan: Diagnosis: Left subcortical infarct with mild right hemiparesis 1. Does the need for close, 24 hr/day medical supervision in concert with the patient's rehab needs make it unreasonable for this patient to be served in a less intensive setting? No 2. Co-Morbidities requiring supervision/potential complications: Hypertension, severe dementia 3. Due to bladder management, bowel management, safety, skin/wound care, disease management, medication administration and patient education, does the patient require 24 hr/day rehab nursing? No 4. Does the patient require coordinated care of a physician, rehab nurse, Not applicable to address physical and functional deficits in the context of the above medical diagnosis(es)? No Addressing deficits in the following areas: balance, endurance, locomotion, strength, transferring, bowel/bladder control, bathing, dressing, feeding, grooming, toileting, cognition and speech 5. Can the patient actively participate in an intensive therapy program of at least 3 hrs of therapy per day at least 5 days per week? No 6. The potential for patient to make measurable gains while on inpatient rehab is poor 7. Anticipated functional outcomes upon discharge from inpatient rehab are not applicable with PT, not applicable with OT, not applicable with SLP. 8. Estimated rehab length of stay to reach the above functional goals is: Not applicable 9. Does the patient have adequate social supports to accommodate these discharge functional goals? Potentially 10. Anticipated D/C setting: SNF then home 11. Anticipated post D/C  treatments: HH therapy 12. Overall Rehab/Functional Prognosis: fair  RECOMMENDATIONS: This patient's condition is appropriate for continued rehabilitative care in the following setting: SNF Patient has agreed to participate in recommended program. Unable to agree secondary to mental status Note that insurance  prior authorization may be required for reimbursement for recommended care.  Comment: Severe dementia unable to adequately participate in [facility]. program    09/12/2012

## 2012-09-13 DIAGNOSIS — R4182 Altered mental status, unspecified: Secondary | ICD-10-CM

## 2012-09-13 MED ORDER — LOPERAMIDE HCL 2 MG PO CAPS
2.0000 mg | ORAL_CAPSULE | Freq: Once | ORAL | Status: AC
  Administered 2012-09-13: 2 mg via ORAL
  Filled 2012-09-13: qty 1

## 2012-09-13 NOTE — Clinical Social Work Note (Signed)
CSW met with wife and pt at bedside to discuss d/c plans this morning.  RN was also at bedside administering meds.  Wife is agreeable to d/c.  CSW spoke to [place] at [facility], SNF who was also agreeable to d/c this morning.  CSW verified d/c summary received.  Wife to follow EMS to SNF to complete paperwork.  Vickii Penna, LCSWA [phone]  Clinical Social Work

## 2012-09-13 NOTE — Progress Notes (Signed)
Speech Language Pathology Dysphagia Treatment Patient Details Name: Dustin Roy MRN: [id number] DOB: 05-Jan-1921 Today's Date: 09/13/2012 Time: 1030-1100 SLP Time Calculation (min): 30 min  Assessment / Plan / Recommendation Clinical Impression  Pt fully alert today, sitting of edge of bed with RN. No evidence of aspiration or struggle with nectar thick liquids. Per wife, he is eating pureed textures well. SLP offered trials of thin liquids, no evidence of aspiration with self fed continuous straw sips.  Mentation is slowly improving. He follows commands well, but language of confusion and disorientation present despite max cues. Max cues given to utilize calandar for orientation. Provided education to wife to repeat orientation information frequently to pt. Will upgrade diet prior to d/c to Dys 3/thin. Wife aware of precautions to make sure pt is alert.     Diet Recommendation  Initiate / Change Diet: Dysphagia 3 (mechanical soft);Thin liquid    SLP Plan Continue with current plan of care   Pertinent Vitals/Pain NA   Swallowing Goals  SLP Swallowing Goals Patient will consume recommended diet without observed clinical signs of aspiration with: Total assistance Swallow Study Goal #1 - Progress: Met Patient will utilize recommended strategies during swallow to increase swallowing safety with: Minimal cueing Swallow Study Goal #2 - Progress: Progressing toward goal  General Temperature Spikes Noted: No Respiratory Status: Room air Behavior/Cognition: Alert;Cooperative;Pleasant mood;Requires cueing Oral Cavity - Dentition: Adequate natural dentition Patient Positioning: Other (comment) (edge of bed)  Oral Cavity - Oral Hygiene Does patient have any of the following "at risk" factors?: Other - dysphagia;Diet - patient on thickened liquids;Nutritional status - dependent feeder;Saliva - thick, dry mouth Patient is AT RISK - Oral Care Protocol followed (see row info): Yes   Dysphagia  Treatment Treatment focused on: Patient/family/caregiver education;Skilled observation of diet tolerance;Upgraded PO texture trials;Utilization of compensatory strategies Family/Caregiver Educated: Spouse Treatment Methods/Modalities: Differential diagnosis;Skilled observation Patient observed directly with PO's: Yes Type of PO's observed: Thin liquids;Dysphagia 3 (soft) Feeding: Able to feed self;Needs assist Liquids provided via: Cup;Straw Oral Phase Signs & Symptoms: Prolonged oral phase;Prolonged bolus formation Pharyngeal Phase Signs & Symptoms: Suspected delayed swallow initiation;Delayed throat clear Type of cueing: Tactile Amount of cueing: Minimal   GO    Harlon Ditty, MA CCC-SLP [phone]  Claudine Mouton 09/13/2012, 11:06 AM

## 2012-09-13 NOTE — Discharge Summary (Signed)
Patient seen, discussed in multidisciplinary rounds and vital/labs reviewed. No changes from yesterday. No changes to discharge summary. For skilled nursing facility today.

## 2012-09-13 NOTE — Clinical Social Work Note (Signed)
CSW called [facility] for transportation.  CSW verified with RN that pt was ready for d/c.    Vickii Penna, LCSWA [phone]  Clinical Social Work

## 2012-09-13 NOTE — Progress Notes (Signed)
Stroke Team Progress Note  HISTORY Dustin Roy is a 79 y.o. male who had been having declining memory over the last year and especially the last 6 months per wife. He had been seen as a out patient by his PCP for his declining memory and offered a trial of Aricept. His wife was not sure about starting this medication due to fear of some of the side effects. He does have known B12 deficiency which he received one injection for but, per wife, this did not have a large effect and his levels did not increase. 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 Over the past two weeks prior to admission his wife noted his speech had become less coherent (at times normal and other times making no sense). Over the past two days prior to admission she could not understand what he was saying. The patient was admitted to the hospital due to falling in his bathroom and having a contusion on his head. Initial CT head showed moderate atrophy and no blood. Neurology was asked to see patient for evaluation of dementia and initiating Aricept.   An MRI on 09/04/2012 showed a small acute white matter lacunar infarct in the left frontal lobe.  SUBJECTIVE  Patient lying in bed. Feels better, Wife in room, says his rash is just located on his back now. No new problems. .  OBJECTIVE Most recent Vital Signs: Filed Vitals:   09/12/12 1813 09/12/12 2118 09/13/12 0200 09/13/12 0738  BP: 161/61 147/55 147/53 151/62  Pulse: 88 83 88 84  Temp: 97.8 F (36.6 C) 98.2 F (36.8 C) 98.8 F (37.1 C) 98 F (36.7 C)  TempSrc: Oral Oral Oral Oral  Resp: 20 20 19 20   Height:      Weight:      SpO2: 99% 96% 98% 94%   CBG (last 3)  No results found for this basename: GLUCAP,  in the last 72 hours  IV Fluid Intake:      MEDICATIONS  . clopidogrel  75 mg Oral Q breakfast  . enoxaparin (LOVENOX) injection  40 mg Subcutaneous Q24H  . hydrocortisone cream   Topical TID  .  levothyroxine  50 mcg Oral QAC breakfast  . phenytoin  300 mg Oral QHS   PRN:  acetaminophen, haloperidol lactate, RESOURCE THICKENUP CLEAR  Diet:  Dysphagia 1 nectar thick Activity:   Up with assistance DVT Prophylaxis:  Lovenox 40 mg sq daily   CLINICALLY SIGNIFICANT STUDIES Basic Metabolic Panel:   Recent Labs Lab 09/07/12 0545 09/08/12 1746 09/11/12 0500  NA 138 139  --   K 3.5 3.7  --   CL 104 105  --   CO2 28 27  --   GLUCOSE 129* 136*  --   BUN 19 12  --   CREATININE 0.94 0.94 0.80  CALCIUM 8.7 9.1  --    Liver Function Tests: No results found for this basename: AST, ALT, ALKPHOS, BILITOT, PROT, ALBUMIN,  in the last 168 hours CBC:   Recent Labs Lab 09/07/12 0545 09/08/12 1746  WBC 7.2 9.1  NEUTROABS  --  6.9  HGB 12.3* 13.0  HCT 35.5* 36.9*  MCV 99.2 99.5  PLT 199 211   Coagulation: No results found for this basename: LABPROT, INR,  in the last 168 hours Cardiac Enzymes: No results found for this basename: CKTOTAL, CKMB, CKMBINDEX, TROPONINI,  in the last 168 hours Urinalysis:   Recent  Labs Lab 09/08/12 2302  COLORURINE AMBER*  LABSPEC 1.021  PHURINE 5.5  GLUCOSEU NEGATIVE  HGBUR NEGATIVE  BILIRUBINUR NEGATIVE  KETONESUR NEGATIVE  PROTEINUR NEGATIVE  UROBILINOGEN 1.0  NITRITE NEGATIVE  LEUKOCYTESUR NEGATIVE   Lipid Panel    Component Value Date/Time   CHOL 140 09/07/2012 0545   TRIG 101 09/07/2012 0545   HDL 50 09/07/2012 0545   CHOLHDL 2.8 09/07/2012 0545   VLDL 20 09/07/2012 0545   LDLCALC 70 09/07/2012 0545   HgbA1C  Lab Results  Component Value Date   HGBA1C 5.8* 09/07/2012    Urine Drug Screen:   No results found for this basename: labopia,  cocainscrnur,  labbenz,  amphetmu,  thcu,  labbarb    Alcohol Level: No results found for this basename: ETH,  in the last 168 hours  Mr Brain Wo Contrast  09/09/2012 matter infarction in the left frontal white matter inferior to the previously seen small cluster of infarctions. Those previously  seen infarctions have not changed in any significant fashion. No evidence of hemorrhage or mass effect. 09/04/2012 1. Small acute white matter lacunar infarct in the left frontal lobe. No mass effect or acute hemorrhage. 2. Mild for age signal changes elsewhere in the brain compatible with chronic small vessel disease.  CT of the brain Advanced atrophy and microvascular ischemic disease without acute intracranial process.  MRA of the brain - not performed  2D Echocardiogram EF 60%, LV fxn appears preserved.  Carotid Doppler Bilateral carotid artery duplex: Less than 39% ICA stenosis. Vertebral artery flow is antegrade.  CXR Mild left lower lobe atelectasis  CXR 09/08/2012 - No acute cardiopulmonary disease. Interval clearing of the left lung base.  EEG This is an abnormal EEG secondary to posterior background slowing. This finding may be seen with a diffuse gray matter disturbance that is etiologically nonspecific, but may include a dementia, among other possibilities.  EKG sinus rhythm left bundle branch block.  EEG CT was abnormal with mild to moderate generalized continuous nonspecific slowing of cerebral activity. Right knee xray  Moderate joint effusion. No definite acute fracture or dislocation at the right knee. Right ankle xray Lateral soft tissue swelling. No acute fracture or dislocation identified about the right ankle.  Therapy Recommendations CIR  Physical Exam General: The patient is  more alert than yesterday. Skin: No significant peripheral edema is noted, with the exception that the right knee and ankle are swollen and painful. Neurologic Exam Cranial nerves: Facial symmetry is present.  He will blink to threat bilaterally. Non-verbal. Motor: Poor motor effort of all 4's. Drift off both arms, pain with manipulation of the right leg. Coordination: The patient would not cooperate for cerebellar testing. Gait and station: The patient could not be ambulated. Reflexes: Deep tendon  reflexes are symmetric.   ASSESSMENT Dustin Roy is a 79 y.o. male with dementia, presenting with fall in bathroom and confusion. Initial scan showed small left lacunar infarct in frontal lobe. Patient has since had an episode of where he was arching his arms and was non responsive in the room, witnessed by wife and staff; felt to be a seizure. On aspirin 81 mg orally every day prior to admission. Now on aspirin suppository for secondary stroke prevention. Stroke workup complete.   Extreme sedation, resolving. Related to keppra. Changed keppra to dilantin 6/30 - sedation is resolving. New Rash. Better today. Not diffuse, today on back. Not felt to be related to dilantin. Right ankle red, warm to touch. No  fx per xray - recommend ankle brace. Right ankle and knee pain due to trauma from fall at stroke onset. No hx gout per wife. right ankle brace on. No pain with movement today. Hypertension  Hyperlipidemia, LDL 70 at goal.  Long term medication use  Dementia  Seizures - none reported since Friday .  Fever resolved - chest x-ray improved.  Hospital day # 11  TREATMENT/PLAN  Continue  clopidogrel 75 mg orally every day for secondary stroke prevention.   Rash improving. Continue Dilantin.  SNF discharge anticipated perhaps today  Follow up with Dr. Pearlean Brownie 2 months after discharge  Stroke service will sign off.  Gwendolyn Lima. Manson Passey, [facility], MBA, MHA [facility] Pager: [phone] 09/13/2012 1:18 PM  I have personally obtained a history, examined the patient, evaluated imaging results, and formulated the assessment and plan of care. I agree with the above.  Delia Heady, MD

## 2012-09-13 NOTE — Progress Notes (Signed)
Patient to be discharged to skilled nursing facility via non emergent EMS transport. Patient information packet given to transport Staff to give to facility upon arrival. Wife at bedside at time of transport. Dustin Roy, Dustin Roy

## 2012-09-18 ENCOUNTER — Other Ambulatory Visit: Payer: Medicare Other | Admitting: Radiology

## 2012-09-18 ENCOUNTER — Encounter: Payer: Self-pay | Admitting: Neurology

## 2012-09-18 ENCOUNTER — Ambulatory Visit (INDEPENDENT_AMBULATORY_CARE_PROVIDER_SITE_OTHER): Payer: Medicare Other | Admitting: Neurology

## 2012-09-18 VITALS — BP 122/68 | HR 91

## 2012-09-18 DIAGNOSIS — G40219 Localization-related (focal) (partial) symptomatic epilepsy and epileptic syndromes with complex partial seizures, intractable, without status epilepticus: Secondary | ICD-10-CM

## 2012-09-18 MED ORDER — DIVALPROEX SODIUM 250 MG PO DR TAB: 250.0000 mg | DELAYED_RELEASE_TABLET | Freq: Two times a day (BID) | ORAL | Status: DC

## 2012-09-18 MED ORDER — VALPROATE SODIUM 500 MG/5ML IV SOLN: 500.0000 mg | Freq: Once | INTRAVENOUS | Status: AC

## 2012-09-18 NOTE — Patient Instructions (Signed)
Patient is having refractory complex partial seizures and had one such episode witnessed by me in the office. He will be given IV Depacon loading dose 100 mg in the office today to be followed by 250 mg twice daily. If episodes continue increase if tolerated to 500 mg twice daily. Decrease Dilantin to 200 mg at night for 2 weeks and then to 100 mg at night for 2 weeks and stop as it has not been effective. Start Aricept 5 mg daily for dementia and will increase on followup visit if tolerated. Return for followup in 2 months with Larita Fife, NP

## 2012-09-20 NOTE — Progress Notes (Signed)
[facility] [address] [place]. [place] [address] [phone]       OFFICE FOLLOW-UP NOTE  Dustin Roy Date of Birth:  Nov 06, 1920 Medical Record Number:  [id number]   HPI: Dustin Roy is a 79 year old Caucasian male who is seen today for her first office followup visit for recent admission to hospital for a fall and stroke. He is unable to provide history which is up 10 from his wife who states that he's had progressive cognitive decline and dementia who the last one year particularly last 6 months he had he was seen by his family care physician who offered her outpatient trial of Aricept but the patient had not started this. He was found to have low vitamin D B12 and was started on injections. 2 days prior to admission to the hospital the wife a difficult time understanding what the patient was saying and he fell in the bathroom and suffered a minor contusion on his head. CT head at admission showed moderate generalized atrophy without any acute abnormality. MRI scan subsequently showed a small lacunar infarct in the right frontal lobe. Carotid Doppler showed no significant extracranial stenosis. Transthoracic echo showed normal ejection fraction. He showed posterior background slowing without definite cribriform activity. Patient subsequently had witnessed seizures during the hospitalization  On 6/25/14and was started on Dilantin after he developed sedation with Keppra which was discontinued. Prior to discharge the wife noted her papular erythematous rash on the patient's back and it was unclear whether this was related to Dilantin. Dilantin was continued at time of discharge. Patient has continued to have episodes of possible seizure since discharge on a daily basis. The wife states that he has spells when his staging and is barely responsive and has glassy eye and a blank look on his face this last for less than a minute and then he comes out of it. She has not noticed  any twirling of his fingers but he does have some drooling during these episodes. The patient in fact had 2 this morning on the way to my office and I witnessed one such episode myself  during this office visit.. patient is able to walk with physical therapists and even ride a stationary bike. He needs some help but is able to take his pelvis and each and swallow. He has had intermittent agitation and fluctuation of his mental status and is currently at [facility] skilled nursing facility. ROS:   14 system review of systems is positive for  rash, itching, incontinence, constipation, urination problems, easy bruising and bleeding, memory loss, confusion, slurred speech, dizziness, seizure and restless legs. PMH:  Past Medical History  Diagnosis Date  . Hypertension   . Hypothyroid   . Macular degeneration     left eye?    Social History:  History   Social History  . Marital Status: Married    Spouse Name: N/A    Number of Children: N/A  . Years of Education: N/A   Occupational History  . Not on file.   Social History Main Topics  . Smoking status: Never Smoker   . Smokeless tobacco: Not on file  . Alcohol Use: Not on file  . Drug Use: Not on file  . Sexually Active: Not on file   Other Topics Concern  . Not on file   Social History Narrative  . No narrative on file    Medications:   Current Outpatient Prescriptions on File Prior to Visit  Medication Sig Dispense Refill  .  aspirin EC 81 MG tablet Take 81 mg by mouth daily.      Marland Kitchen atorvastatin (LIPITOR) 10 MG tablet Take 1 tablet (10 mg total) by mouth at bedtime.  30 tablet  0  . clopidogrel (PLAVIX) 75 MG tablet Take 1 tablet (75 mg total) by mouth daily with breakfast.  30 tablet  3  . hydrocortisone cream 1 % Apply topically 3 (three) times daily. X 2 days  30 g  0  . levothyroxine (SYNTHROID, LEVOTHROID) 50 MCG tablet Take 50 mcg by mouth daily before breakfast.      . phenytoin (DILANTIN) 300 MG ER capsule Take 1 capsule  (300 mg total) by mouth at bedtime.  30 capsule  3  . triamterene-hydrochlorothiazide (MAXZIDE) 75-50 MG per tablet Take 0.5 tablets by mouth daily.       No current facility-administered medications on file prior to visit.    Allergies:   Allergies  Allergen Reactions  . Amoxicillin Other (See Comments)    Hallucinations; fever   Filed Vitals:   09/18/12 1455  BP: 122/68  Pulse: 91    Physical Exam General: Frail elderly Caucasian male, seated, in no evident distress Head: head normocephalic and atraumatic. Orohparynx benign Neck: supple with no carotid or supraclavicular bruits Cardiovascular: regular rate and rhythm, no murmurs Musculoskeletal: Mild kyphosis . Right ankle swelling and pain to touch. Wearing a right ankle brace Skin:  no rash/petichiae Vascular:  Normal pulses all extremities  Neurologic Exam Mental Status: Awake and  alert. disriented to place and time. Recent and remote memory diminished Attention span, concentration and fund of knowledge poor. Speech almost unintelligible. Follows simple one and occasional two-step commands. Intermittently agitated. Can be redirected. Unable to do full Mini-Mental status exam Cranial Nerves: Fundoscopic exam reveals sharp disc margins. Pupils equal, briskly reactive to light. Extraocular movements full without nystagmus. Visual fields difficult to test Hearing diminished bilaterally .Facial sensation intact. Face, tongue, palate moves normally and symmetrically.  Motor: Normal bulk and tone. Normal strength in all tested extremity muscles. Sensory.: Withdraws to pain equally in all 4 extremities.  Coordination: Unable to cooperate to test finger to nose and knee to his coordination  Gait and Station: Arises from chair with minimal initiation apraxia. Slightly broad-based gait but able to walk independently. Unable to perform tandem walking Reflexes: 1+ and symmetric. Toes downgoing.     ASSESSMENT:  79 year old male with  progressive dementia over the last 6 months with a recent admission to [facility] last week with lacunar left frontal infarct  Due to small vessel diseaseand a fall. He was found to have complex partial seizures during the hospitalization on 09/05/12 and started on Dilantin after he developed sedation with Keppra but continues to have breakthrough seizures on a daily basis and had one witnessed one in my office today.   PLAN: Start Depakote for seizure prophylaxis. IV Depacon injection was attempted but patient was restless and did not cooperate for IV insertion and hence was given a prescription to start Depakote 250 twice a day for 2 weeks and increase the Provigil to find it twice a day. Recommend taper Dilantin to 200 mg at night for 2 weeks and then 100 mg at night for 2 weeks then stop. Patient also advised to start Aricept 5 mg daily to help with his dementia and behavioral agitation. He was advised to return for followup to see lens, practitioner.

## 2012-10-05 ENCOUNTER — Encounter (HOSPITAL_COMMUNITY): Payer: Self-pay | Admitting: Radiology

## 2012-10-05 ENCOUNTER — Emergency Department (HOSPITAL_COMMUNITY): Payer: Medicare Other

## 2012-10-05 ENCOUNTER — Emergency Department (HOSPITAL_COMMUNITY)
Admission: EM | Admit: 2012-10-05 | Discharge: 2012-10-05 | Disposition: A | Discharge status: Skilled Nursing Facility | Payer: Medicare Other | Attending: Emergency Medicine | Admitting: Emergency Medicine

## 2012-10-05 DIAGNOSIS — R4182 Altered mental status, unspecified: Secondary | ICD-10-CM | POA: Insufficient documentation

## 2012-10-05 DIAGNOSIS — Z8673 Personal history of transient ischemic attack (TIA), and cerebral infarction without residual deficits: Secondary | ICD-10-CM | POA: Insufficient documentation

## 2012-10-05 DIAGNOSIS — G40909 Epilepsy, unspecified, not intractable, without status epilepticus: Secondary | ICD-10-CM | POA: Insufficient documentation

## 2012-10-05 DIAGNOSIS — E039 Hypothyroidism, unspecified: Secondary | ICD-10-CM | POA: Insufficient documentation

## 2012-10-05 DIAGNOSIS — I10 Essential (primary) hypertension: Secondary | ICD-10-CM | POA: Insufficient documentation

## 2012-10-05 DIAGNOSIS — F0390 Unspecified dementia without behavioral disturbance: Secondary | ICD-10-CM | POA: Insufficient documentation

## 2012-10-05 DIAGNOSIS — Z79899 Other long term (current) drug therapy: Secondary | ICD-10-CM | POA: Insufficient documentation

## 2012-10-05 DIAGNOSIS — Z8669 Personal history of other diseases of the nervous system and sense organs: Secondary | ICD-10-CM | POA: Insufficient documentation

## 2012-10-05 DIAGNOSIS — R319 Hematuria, unspecified: Secondary | ICD-10-CM

## 2012-10-05 LAB — URINE MICROSCOPIC-ADD ON

## 2012-10-05 LAB — COMPREHENSIVE METABOLIC PANEL
ALT: 9 U/L (ref 0–53)
Alkaline Phosphatase: 102 U/L (ref 39–117)
GFR calc Af Amer: 67 mL/min — ABNORMAL LOW (ref >90)
Glucose, Bld: 118 mg/dL — ABNORMAL HIGH (ref 70–99)
Potassium: 3.5 mEq/L (ref 3.5–5.1)
Sodium: 136 mEq/L (ref 135–145)
Total Protein: 6 g/dL (ref 6.0–8.3)

## 2012-10-05 LAB — CBC WITH DIFFERENTIAL/PLATELET
Eosinophils Absolute: 0.1 10*3/uL (ref 0.0–0.7)
Lymphocytes Relative: 14 % (ref 12–46)
Lymphs Abs: 1.4 10*3/uL (ref 0.7–4.0)
Neutro Abs: 7.3 10*3/uL (ref 1.7–7.7)
Neutrophils Relative %: 72 % (ref 43–77)
Platelets: 195 10*3/uL (ref 150–400)
RBC: 3.36 MIL/uL — ABNORMAL LOW (ref 4.22–5.81)
WBC: 10.1 10*3/uL (ref 4.0–10.5)

## 2012-10-05 LAB — APTT: aPTT: 23 seconds — ABNORMAL LOW (ref 24–37)

## 2012-10-05 LAB — URINALYSIS, ROUTINE W REFLEX MICROSCOPIC
Glucose, UA: NEGATIVE mg/dL
Ketones, ur: 15 mg/dL — AB
pH: 6 (ref 5.0–8.0)

## 2012-10-05 LAB — PHENYTOIN LEVEL, TOTAL: Phenytoin Lvl: <2.5 ug/mL — ABNORMAL LOW (ref 10.0–20.0)

## 2012-10-05 LAB — VALPROIC ACID LEVEL: Valproic Acid Lvl: 56.7 ug/mL (ref 50.0–100.0)

## 2012-10-05 MED ORDER — SODIUM CHLORIDE 0.9 % IV BOLUS (SEPSIS)
500.0000 mL | Freq: Once | INTRAVENOUS | Status: AC
  Administered 2012-10-05: 500 mL via INTRAVENOUS

## 2012-10-05 NOTE — ED Notes (Signed)
Placed call to [facility] for transportation back home 

## 2012-10-05 NOTE — ED Notes (Signed)
Report given to [facility] at [facility] nursing home

## 2012-10-05 NOTE — ED Provider Notes (Signed)
CSN: [id number]     Arrival date & time 10/05/12  1057 History     First MD Initiated Contact with Patient 10/05/12 1059     Chief Complaint  Patient presents with  . Weakness   (Consider location/radiation/quality/duration/timing/severity/associated sxs/prior Treatment) HPI Pt with advanced dementia, seizures and recent lacunar CVA present from NH for AMS and right-sided weakness. Pt is non-verbal at this time and leve 5 caveat applies. Per NH staff pt was agitated this morning and pulling at his foley catheter. Was given ativan and xanax at 8 am. Per staff, started noticing R leg "giving out" and R facial drooling around 9 am. Per NH staff report that his wife thought he seemed weak on the right yesterday. Pt is now sonorous.  Past Medical History  Diagnosis Date  . Hypertension   . Hypothyroid   . Macular degeneration     left eye?   Past Surgical History  Procedure Laterality Date  . Tonsillectomy    . Nasal polyp excision     Family History  Problem Relation Age of Onset  . Cancer Mother     colon  . Aortic aneurysm Father   . Diabetes Sister   . Cancer - Other Sister     brain   History  Substance Use Topics  . Smoking status: Never Smoker   . Smokeless tobacco: Not on file  . Alcohol Use: Not on file    Review of Systems  Unable to perform ROS   Allergies  Amoxicillin  Home Medications   Current Outpatient Rx  Name  Route  Sig  Dispense  Refill  . acetaminophen (TYLENOL) 325 MG tablet   Oral   Take 650 mg by mouth every 4 (four) hours as needed for pain or fever.         . ALPRAZolam (XANAX) 0.5 MG tablet   Oral   Take 0.5 mg by mouth 2 (two) times daily.         Marland Kitchen atorvastatin (LIPITOR) 10 MG tablet   Oral   Take 1 tablet (10 mg total) by mouth at bedtime.   30 tablet   0   . clopidogrel (PLAVIX) 75 MG tablet   Oral   Take 1 tablet (75 mg total) by mouth daily with breakfast.   30 tablet   3   . divalproex (DEPAKOTE SPRINKLE) 125 MG  capsule   Oral   Take 500 mg by mouth 2 (two) times daily.         Marland Kitchen levothyroxine (SYNTHROID, LEVOTHROID) 50 MCG tablet   Oral   Take 50 mcg by mouth daily before breakfast.         . phenytoin (DILANTIN) 100 MG ER capsule   Oral   Take 100 mg by mouth at bedtime. For 2 weeks         . PRESCRIPTION MEDICATION   Topical   Apply 1 mg topically See admin instructions. Apply 1 mg every 2 hours as needed for seizure activity and anxiety         . triamterene-hydrochlorothiazide (MAXZIDE) 75-50 MG per tablet   Oral   Take 0.5 tablets by mouth daily.         Marland Kitchen zolpidem (AMBIEN) 5 MG tablet   Oral   Take 5 mg by mouth at bedtime as needed for sleep.          BP 100/52  Pulse 84  Temp(Src) 97.7 F (36.5 C) (Axillary)  Resp 16  SpO2 99% Physical Exam  Nursing note and vitals reviewed. Constitutional: He appears well-developed. No distress.  Sleeping soundly  HENT:  Head: Normocephalic.  Small contusion to forehead Dry MM  Eyes: EOM are normal. Pupils are equal, round, and reactive to light.  Neck: Normal range of motion. Neck supple.  Cardiovascular: Normal rate and regular rhythm.   Pulmonary/Chest: Effort normal and breath sounds normal. No respiratory distress. He has no wheezes. He has no rales. He exhibits no tenderness.  Abdominal: Soft. Bowel sounds are normal. He exhibits no distension and no mass. There is no rebound and no guarding.  Musculoskeletal: Normal range of motion. He exhibits no edema and no tenderness.  Neurological:  Pt responds to sternal rub reaching with both hands  Skin: Skin is warm and dry. No rash noted. No erythema.    ED Course   Procedures (including critical care time)  Labs Reviewed  CBC WITH DIFFERENTIAL - Abnormal; Notable for the following:    RBC 3.36 (*)    Hemoglobin 11.5 (*)    HCT 34.2 (*)    MCV 101.8 (*)    MCH 34.2 (*)    Monocytes Relative 13 (*)    Monocytes Absolute 1.3 (*)    All other components within  normal limits  COMPREHENSIVE METABOLIC PANEL - Abnormal; Notable for the following:    Chloride 92 (*)    Glucose, Bld 118 (*)    BUN 27 (*)    Albumin 2.6 (*)    GFR calc non Af Amer 58 (*)    GFR calc Af Amer 67 (*)    All other components within normal limits  URINALYSIS, ROUTINE W REFLEX MICROSCOPIC - Abnormal; Notable for the following:    Color, Urine RED (*)    APPearance TURBID (*)    Specific Gravity, Urine 1.031 (*)    Hgb urine dipstick LARGE (*)    Bilirubin Urine SMALL (*)    Ketones, ur 15 (*)    Protein, ur >300 (*)    Urobilinogen, UA 2.0 (*)    Nitrite POSITIVE (*)    Leukocytes, UA MODERATE (*)    All other components within normal limits  APTT - Abnormal; Notable for the following:    aPTT 23 (*)    All other components within normal limits  PHENYTOIN LEVEL, TOTAL - Abnormal; Notable for the following:    Phenytoin Lvl <2.5 (*)    All other components within normal limits  URINE MICROSCOPIC-ADD ON - Abnormal; Notable for the following:    Bacteria, UA MANY (*)    All other components within normal limits  URINE CULTURE  TROPONIN I  PROTIME-INR  VALPROIC ACID LEVEL   Ct Head Wo Contrast  10/05/2012   *RADIOLOGY REPORT*  Clinical Data: Right-sided weakness.  Altered mental status.  CT HEAD WITHOUT CONTRAST  Technique:  Contiguous axial images were obtained from the base of the skull through the vertex without contrast.  Comparison: 09/05/2012 and MRI of 09/09/2012  Findings: Bone windows demonstrate cataracts extraction bilaterally.  Minimal fluid in the bilateral mastoid air cells. Chronic on the left.  Soft tissue windows demonstrate atrophy and moderate small vessel ischemic change. No  mass lesion, hemorrhage, hydrocephalus, acute infarct, intra-axial, or extra-axial fluid collection.  The tiny left-sided hemispheric infarcts described on the prior MRI are not readily apparent.  IMPRESSION:  1. No acute intracranial abnormality. 2.  Cerebral atrophy and small  vessel ischemic change, within normal variation in this age group. 3.  Small bilateral mastoid effusions.  Likely chronic on the left.   Original Report Authenticated By: Jeronimo Greaves, M.D.   Dg Chest Port 1 View  10/05/2012   *RADIOLOGY REPORT*  Clinical Data: Altered mental status.  Hypertension.  PORTABLE CHEST - 1 VIEW  Comparison: 09/08/2012.  09/02/2012.  Findings: Cardiopericardial silhouette is within normal limits. There is a tiny focus of patchy airspace density in the left base projected over the left anterior fifth rib end.  This may represent summation shadows, small focus of infection/pneumonia or less likely a small focus of atelectasis.  Attention on follow-up recommended. Bilateral pleural apical scarring is present.  IMPRESSION: Tiny focus of patchy density at the left lung base may represent a small focus of bronchopneumonia, atelectasis or less likely summation shadows.  Attention on follow-up recommended.   Original Report Authenticated By: Andreas Newport, M.D.   1. Altered mental status   2. Hematuria     Date: 10/05/2012  Rate: 91  Rhythm: normal sinus rhythm  QRS Axis: normal  Intervals: QRS prolonged  ST/T Wave abnormalities: nonspecific T wave changes  Conduction Disutrbances:left bundle branch block  Narrative Interpretation:   Old EKG Reviewed: unchanged   MDM  Discussed with Dr Roseanne Reno. Agrees with not calling code stroke based on age, CVA within 1 month, unclear onset. Negative workup. Pt has returned to baseline mental status. Suspect symptoms for over-sedation. Discussed with family and will d/c back to NH.  Pt to f/u with urology for hematuria  Loren Racer, MD 10/06/12 [phone]

## 2012-10-05 NOTE — Discharge Instructions (Signed)
Hematuria, Adult Hematuria (blood in your urine) can be caused by a bladder infection (cystitis), kidney infection (pyelonephritis), prostate infection (prostatitis), or kidney stone. Infections will usually respond to antibiotics (medications which kill germs), and a kidney stone will usually pass through your urine without further treatment. If you were put on antibiotics, take all the medicine until gone. You may feel better in a few days, but take all of your medicine or the infection may not respond and become more difficult to treat. If antibiotics were not given, an infection did not cause the blood in the urine. A further work up to find out the reason may be needed. HOME CARE INSTRUCTIONS   Drink lots of fluid, 3 to 4 quarts a day. If you have been diagnosed with an infection, cranberry juice is especially recommended, in addition to large amounts of water.  Avoid caffeine, tea, and carbonated beverages, because they tend to irritate the bladder.  Avoid alcohol as it may irritate the prostate.  Only take over-the-counter or prescription medicines for pain, discomfort, or fever as directed by your caregiver.  If you have been diagnosed with a kidney stone follow your caregivers instructions regarding straining your urine to catch the stone. TO PREVENT FURTHER INFECTIONS:  Empty the bladder often. Avoid holding urine for long periods of time.  After a bowel movement, women should cleanse front to back. Use each tissue only once.  Empty the bladder before and after sexual intercourse if you are a male.  Return to your caregiver if you develop back pain, fever, nausea (feeling sick to your stomach), vomiting, or your symptoms (problems) are not better in 3 days. Return sooner if you are getting worse. If you have been requested to return for further testing make sure to keep your appointments. If an infection is not the cause of blood in your urine, X-rays may be required. Your caregiver  will discuss this with you. SEEK IMMEDIATE MEDICAL CARE IF:   You have a persistent fever over 102 F (38.9 C).  You develop severe vomiting and are unable to keep the medication down.  You develop severe back or abdominal pain despite taking your medications.  You begin passing a large amount of blood or clots in your urine.  You feel extremely weak or faint, or pass out. MAKE SURE YOU:   Understand these instructions.  Will watch your condition.  Will get help right away if you are not doing well or get worse. Document Released: 02/28/2005 Document Revised: 05/23/2011 Document Reviewed: 10/18/2007 [facility] Patient Information 2014 [place], [place].   Altered Mental Status Altered mental status most often refers to an abnormal change in your responsiveness and awareness. It can affect your speech, thought, mobility, memory, attention span, or alertness. It can range from slight confusion to complete unresponsiveness (coma). Altered mental status can be a sign of a serious underlying medical condition. Rapid evaluation and medical treatment is necessary for patients having an altered mental status. CAUSES   Low blood sugar (hypoglycemia) or diabetes.  Severe loss of body fluids (dehydration) or a body salt (electrolyte) imbalance.  A stroke or other neurologic problem, such as dementia or delirium.  A head injury or tumor.  A drug or alcohol overdose.  Exposure to toxins or poisons.  Depression, anxiety, and stress.  A low oxygen level (hypoxia).  An infection.  Blood loss.  Twitching or shaking (seizure).  Heart problems, such as heart attack or heart rhythm problems (arrhythmias).  A body temperature that  is too low or too high (hypothermia or hyperthermia). DIAGNOSIS  A diagnosis is based on your history, symptoms, physical and neurologic examinations, and diagnostic tests. Diagnostic tests may include:  Measurement of your blood pressure, pulse, breathing,  and oxygen levels (vital signs).  Blood tests.  Urine tests.  X-ray exams.  A computerized magnetic scan (magnetic resonance imaging, MRI).  A computerized X-ray scan (computed tomography, CT scan). TREATMENT  Treatment will depend on the cause. Treatment may include:  Management of an underlying medical or mental health condition.  Critical care or support in the hospital. HOME CARE INSTRUCTIONS   Only take over-the-counter or prescription medicines for pain, discomfort, or fever as directed by your caregiver.  Manage underlying conditions as directed by your caregiver.  Eat a healthy, well-balanced diet to maintain strength.  Join a support group or prevention program to cope with the condition or trauma that caused the altered mental status. Ask your caregiver to help choose a program that works for you.  Follow up with your caregiver for further examination, therapy, or testing as directed. SEEK MEDICAL CARE IF:   You feel unwell or have chills.  You or your family notice a change in your behavior or your alertness.  You have trouble following your caregiver's treatment plan.  You have questions or concerns. SEEK IMMEDIATE MEDICAL CARE IF:   You have a rapid heartbeat or have chest pain.  You have difficulty breathing.  You have a fever.  You have a headache with a stiff neck.  You cough up blood.  You have blood in your urine or stool.  You have severe agitation or confusion. MAKE SURE YOU:   Understand these instructions.  Will watch your condition.  Will get help right away if you are not doing well or get worse. Document Released: 08/18/2009 Document Revised: 05/23/2011 Document Reviewed: 08/18/2009 [facility] Patient Information 2014 [place], [place].

## 2012-10-05 NOTE — ED Notes (Signed)
Patient transported to CT 

## 2012-10-05 NOTE — ED Notes (Signed)
Pt presents from [facility] with right sided weakness X 1 day. Staff states that pt was  "not moving anything" today. Pt was given ativan 1mg  topical  for aggitation. EMS noted general weakness bilateral

## 2012-10-05 NOTE — ED Notes (Signed)
Spoke to [employer] who states that pt received ativan gel at 0800 for agitation. Per rn pt was attempting to pull out his foley catheter. Rn states that at approximately 0900 today pt had  right sided drooling, "buckled when they tried to stand him and he was unable to hold his  Sippy cup with right hand" Per RN pt received xanax at 0800 or 0900 in conjunction with ativan gel. RN states that pt's wife reports weakness at 1300 yesterday.   RN verbally confirmed that pt was DNR

## 2012-10-05 NOTE — ED Notes (Signed)
Facility RN called again to clarify. Per RN  Wife noticed " a little bit of weakness yesterday" "noticable to nurse today at 0800"  RN asked "have I done something wrong"

## 2012-10-07 LAB — URINE CULTURE: Colony Count: >=100000

## 2012-10-08 NOTE — Progress Notes (Signed)
ED Antimicrobial Stewardship Positive Culture Follow Up   Dustin Roy is an 79 y.o. male who presented to [facility] on 10/05/2012 with a chief complaint of weakness  Chief Complaint  Patient presents with  . Weakness    Recent Results (from the past 720 hour(s))  URINE CULTURE     Status: None   Collection Time    10/05/12  2:02 PM      Result Value Range Status   Specimen Description URINE, CLEAN CATCH   Final   Special Requests NONE   Final   Culture  Setup Time 10/05/2012 16:20   Final   Colony Count >=100,000 COLONIES/ML   Final   Culture ESCHERICHIA COLI   Final   Report Status 10/07/2012 FINAL   Final   Organism ID, Bacteria ESCHERICHIA COLI   Final    [x]  No treatment indicated  89 YOM with hx advanced dementia, seizures, and recent ischemic CVA ~1 month ago who presented with AMS and R-sided weakness after receiving two doses of ativan at his NH PTA. The patient was noted to have a foley catheter in place at the NH and is likely chronically colonized. Afebrile, WBC WNL -- discussed with ED-PA and would not treat. Pt was noted to "normal baseline" prior to discharge back to the NH.  New antibiotic prescription: No treatment indicated.   ED Provider: Sharilyn Sites, PA-C  [name] 10/08/2012, 9:20 AM Infectious Diseases Pharmacist Phone# [phone]

## 2012-11-21 ENCOUNTER — Ambulatory Visit: Payer: Medicare Other | Admitting: Nurse Practitioner

## 2013-03-14 DEATH — deceased

## 2013-03-15 ENCOUNTER — Telehealth: Payer: Self-pay

## 2013-03-15 NOTE — Telephone Encounter (Signed)
Patient past away @ [facility] per [facility] in [place] [employer] °

## 2013-07-06 IMAGING — CR DG CHEST 1V PORT
1 series · 1 of 1 positions shown · non-contrast
Comparison: One-view chest 09/02/2012

CLINICAL DATA: Fever and hypertension.

PORTABLE CHEST - 1 VIEW

[AP]
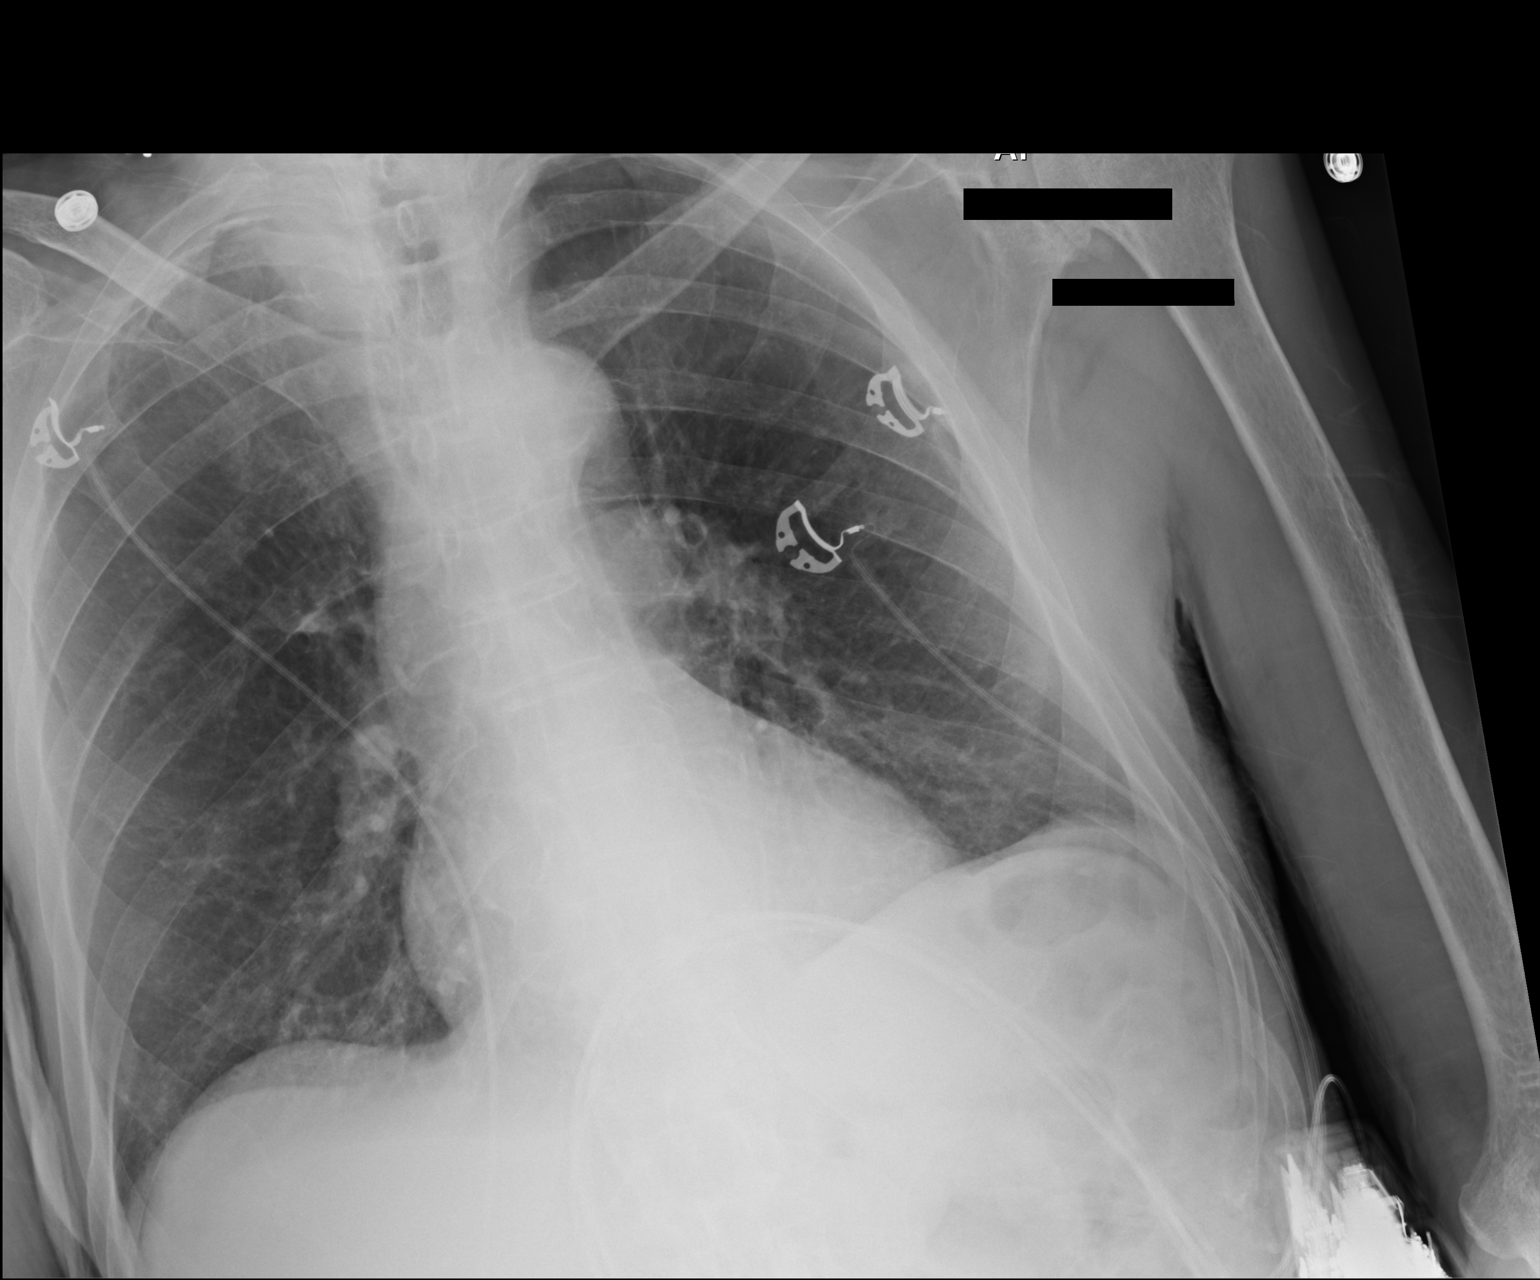

[1 of 1 positions shown; findings below may reference images not displayed]

FINDINGS: The heart size is normal.  The lungs are clear. Aeration
has improved at the left lung base.  The lung volumes are somewhat
low.  Degenerative changes are noted in the shoulders bilaterally.
IMPRESSION: 1.  No acute cardiopulmonary disease.
2.  Interval clearing of the left lung base.
